# Patient Record
Sex: Female | Born: 1975
Health system: Southern US, Community
[De-identification: ages and names within clinical notes are randomized; demographics above are authoritative.]

## PROBLEM LIST (undated history)

## (undated) DIAGNOSIS — E119 Type 2 diabetes mellitus without complications: Secondary | ICD-10-CM

## (undated) DIAGNOSIS — F909 Attention-deficit hyperactivity disorder, unspecified type: Secondary | ICD-10-CM

## (undated) DIAGNOSIS — N83209 Unspecified ovarian cyst, unspecified side: Secondary | ICD-10-CM

## (undated) DIAGNOSIS — K819 Cholecystitis, unspecified: Secondary | ICD-10-CM

---

## 2003-09-05 ENCOUNTER — Emergency Department (HOSPITAL_COMMUNITY): Admission: EM | Admit: 2003-09-05 | Discharge: 2003-09-05 | Payer: Self-pay | Admitting: Emergency Medicine

## 2003-11-16 ENCOUNTER — Emergency Department (HOSPITAL_COMMUNITY): Admission: EM | Admit: 2003-11-16 | Discharge: 2003-11-16 | Payer: Self-pay | Admitting: Emergency Medicine

## 2003-11-19 ENCOUNTER — Emergency Department (HOSPITAL_COMMUNITY): Admission: EM | Admit: 2003-11-19 | Discharge: 2003-11-19 | Payer: Self-pay | Admitting: Emergency Medicine

## 2007-09-28 ENCOUNTER — Emergency Department (HOSPITAL_COMMUNITY): Admission: EM | Admit: 2007-09-28 | Discharge: 2007-09-28 | Payer: Self-pay | Admitting: Emergency Medicine

## 2008-01-30 ENCOUNTER — Emergency Department (HOSPITAL_COMMUNITY): Admission: EM | Admit: 2008-01-30 | Discharge: 2008-01-31 | Payer: Self-pay | Admitting: Emergency Medicine

## 2009-10-15 ENCOUNTER — Ambulatory Visit: Payer: Self-pay | Admitting: Diagnostic Radiology

## 2009-10-15 ENCOUNTER — Emergency Department (HOSPITAL_BASED_OUTPATIENT_CLINIC_OR_DEPARTMENT_OTHER): Admission: EM | Admit: 2009-10-15 | Discharge: 2009-10-15 | Payer: Self-pay | Admitting: Emergency Medicine

## 2009-12-20 ENCOUNTER — Emergency Department (HOSPITAL_BASED_OUTPATIENT_CLINIC_OR_DEPARTMENT_OTHER): Admission: EM | Admit: 2009-12-20 | Discharge: 2009-12-20 | Payer: Self-pay | Admitting: Emergency Medicine

## 2009-12-20 ENCOUNTER — Ambulatory Visit: Payer: Self-pay | Admitting: Diagnostic Radiology

## 2010-03-08 ENCOUNTER — Emergency Department (HOSPITAL_BASED_OUTPATIENT_CLINIC_OR_DEPARTMENT_OTHER): Admission: EM | Admit: 2010-03-08 | Discharge: 2010-03-08 | Payer: Self-pay | Admitting: Emergency Medicine

## 2010-04-01 ENCOUNTER — Emergency Department (HOSPITAL_BASED_OUTPATIENT_CLINIC_OR_DEPARTMENT_OTHER): Admission: EM | Admit: 2010-04-01 | Discharge: 2010-04-01 | Payer: Self-pay | Admitting: Emergency Medicine

## 2010-05-06 ENCOUNTER — Emergency Department (HOSPITAL_BASED_OUTPATIENT_CLINIC_OR_DEPARTMENT_OTHER): Admission: EM | Admit: 2010-05-06 | Discharge: 2010-05-06 | Payer: Self-pay | Admitting: Emergency Medicine

## 2011-03-10 LAB — URINE CULTURE: Colony Count: 45000

## 2011-03-10 LAB — URINE MICROSCOPIC-ADD ON

## 2011-03-10 LAB — URINALYSIS, ROUTINE W REFLEX MICROSCOPIC
Bilirubin Urine: NEGATIVE
Glucose, UA: 250 mg/dL — AB
Ketones, ur: 15 mg/dL — AB
Nitrite: NEGATIVE
Protein, ur: NEGATIVE mg/dL
Specific Gravity, Urine: 1.028 (ref 1.005–1.030)
Urobilinogen, UA: 0.2 mg/dL (ref 0.0–1.0)
pH: 6 (ref 5.0–8.0)

## 2011-03-10 LAB — ABO/RH: ABO/RH(D): A NEG

## 2011-03-10 LAB — HCG, QUANTITATIVE, PREGNANCY: hCG, Beta Chain, Quant, S: 68426 m[IU]/mL — ABNORMAL HIGH (ref <5)

## 2011-03-12 LAB — URINALYSIS, ROUTINE W REFLEX MICROSCOPIC
Bilirubin Urine: NEGATIVE
Glucose, UA: NEGATIVE mg/dL
Hgb urine dipstick: NEGATIVE
Ketones, ur: NEGATIVE mg/dL
Nitrite: NEGATIVE
Protein, ur: NEGATIVE mg/dL
Specific Gravity, Urine: 1.007 (ref 1.005–1.030)
Urobilinogen, UA: 0.2 mg/dL (ref 0.0–1.0)
pH: 5.5 (ref 5.0–8.0)

## 2011-03-12 LAB — PREGNANCY, URINE: Preg Test, Ur: POSITIVE

## 2011-03-12 LAB — URINE MICROSCOPIC-ADD ON

## 2011-03-12 LAB — GLUCOSE, CAPILLARY: Glucose-Capillary: 149 mg/dL — ABNORMAL HIGH (ref 70–99)

## 2011-03-17 LAB — LIPASE, BLOOD: Lipase: 90 U/L (ref 23–300)

## 2011-03-17 LAB — COMPREHENSIVE METABOLIC PANEL
ALT: 34 U/L (ref 0–35)
AST: 25 U/L (ref 0–37)
Albumin: 4.3 g/dL (ref 3.5–5.2)
Alkaline Phosphatase: 60 U/L (ref 39–117)
BUN: 14 mg/dL (ref 6–23)
CO2: 26 mEq/L (ref 19–32)
Calcium: 9.6 mg/dL (ref 8.4–10.5)
Chloride: 107 mEq/L (ref 96–112)
Creatinine, Ser: 0.7 mg/dL (ref 0.4–1.2)
GFR calc Af Amer: >60 mL/min (ref >60)
GFR calc non Af Amer: >60 mL/min (ref >60)
Glucose, Bld: 165 mg/dL — ABNORMAL HIGH (ref 70–99)
Potassium: 4.2 mEq/L (ref 3.5–5.1)
Sodium: 146 mEq/L — ABNORMAL HIGH (ref 135–145)
Total Bilirubin: 0.8 mg/dL (ref 0.3–1.2)
Total Protein: 8.2 g/dL (ref 6.0–8.3)

## 2011-03-17 LAB — URINALYSIS, ROUTINE W REFLEX MICROSCOPIC
Bilirubin Urine: NEGATIVE
Glucose, UA: NEGATIVE mg/dL
Ketones, ur: NEGATIVE mg/dL
Nitrite: NEGATIVE
Protein, ur: 30 mg/dL — AB
Specific Gravity, Urine: 1.001 — ABNORMAL LOW (ref 1.005–1.030)
Urobilinogen, UA: 0.2 mg/dL (ref 0.0–1.0)
pH: 7 (ref 5.0–8.0)

## 2011-03-17 LAB — URINE MICROSCOPIC-ADD ON

## 2011-03-17 LAB — CBC
HCT: 45 % (ref 36.0–46.0)
Hemoglobin: 15.3 g/dL — ABNORMAL HIGH (ref 12.0–15.0)
MCHC: 34 g/dL (ref 30.0–36.0)
MCV: 86 fL (ref 78.0–100.0)
Platelets: 231 10*3/uL (ref 150–400)
RBC: 5.23 MIL/uL — ABNORMAL HIGH (ref 3.87–5.11)
RDW: 12.8 % (ref 11.5–15.5)
WBC: 8 10*3/uL (ref 4.0–10.5)

## 2011-03-17 LAB — DIFFERENTIAL
Basophils Absolute: 0.1 10*3/uL (ref 0.0–0.1)
Basophils Relative: 1 % (ref 0–1)
Eosinophils Absolute: 0.1 10*3/uL (ref 0.0–0.7)
Eosinophils Relative: 1 % (ref 0–5)
Lymphocytes Relative: 8 % — ABNORMAL LOW (ref 12–46)
Lymphs Abs: 0.7 10*3/uL (ref 0.7–4.0)
Monocytes Absolute: 0.3 10*3/uL (ref 0.1–1.0)
Monocytes Relative: 4 % (ref 3–12)
Neutro Abs: 6.8 10*3/uL (ref 1.7–7.7)
Neutrophils Relative %: 86 % — ABNORMAL HIGH (ref 43–77)

## 2011-03-17 LAB — PREGNANCY, URINE: Preg Test, Ur: NEGATIVE

## 2011-03-17 LAB — URINE CULTURE: Colony Count: 25000

## 2011-03-17 LAB — GLUCOSE, CAPILLARY: Glucose-Capillary: 158 mg/dL — ABNORMAL HIGH (ref 70–99)

## 2011-09-12 LAB — BASIC METABOLIC PANEL
BUN: 11
CO2: 23
Calcium: 9.4
Chloride: 106
Creatinine, Ser: 0.79
GFR calc Af Amer: >60
GFR calc non Af Amer: >60
Glucose, Bld: 163 — ABNORMAL HIGH
Potassium: 4.1
Sodium: 138

## 2012-06-06 ENCOUNTER — Emergency Department (HOSPITAL_BASED_OUTPATIENT_CLINIC_OR_DEPARTMENT_OTHER)
Admission: EM | Admit: 2012-06-06 | Discharge: 2012-06-06 | Disposition: A | Discharge status: Home / Self Care | Payer: Self-pay

## 2012-09-30 ENCOUNTER — Encounter (HOSPITAL_COMMUNITY): Payer: Self-pay | Admitting: *Deleted

## 2012-09-30 ENCOUNTER — Emergency Department (HOSPITAL_COMMUNITY)
Admission: EM | Admit: 2012-09-30 | Discharge: 2012-09-30 | Disposition: A | Discharge status: Home / Self Care | Payer: Medicare Other | Attending: Emergency Medicine | Admitting: Emergency Medicine

## 2012-09-30 DIAGNOSIS — Z882 Allergy status to sulfonamides status: Secondary | ICD-10-CM | POA: Insufficient documentation

## 2012-09-30 DIAGNOSIS — F17200 Nicotine dependence, unspecified, uncomplicated: Secondary | ICD-10-CM | POA: Insufficient documentation

## 2012-09-30 DIAGNOSIS — A6009 Herpesviral infection of other urogenital tract: Secondary | ICD-10-CM

## 2012-09-30 DIAGNOSIS — A6000 Herpesviral infection of urogenital system, unspecified: Secondary | ICD-10-CM | POA: Insufficient documentation

## 2012-09-30 DIAGNOSIS — E119 Type 2 diabetes mellitus without complications: Secondary | ICD-10-CM | POA: Insufficient documentation

## 2012-09-30 LAB — URINALYSIS, ROUTINE W REFLEX MICROSCOPIC
Nitrite: NEGATIVE
Specific Gravity, Urine: 1.041 — ABNORMAL HIGH (ref 1.005–1.030)
Urobilinogen, UA: 0.2 mg/dL (ref 0.0–1.0)
pH: 5 (ref 5.0–8.0)

## 2012-09-30 LAB — URINE MICROSCOPIC-ADD ON

## 2012-09-30 LAB — HCG, QUANTITATIVE, PREGNANCY: hCG, Beta Chain, Quant, S: <1 m[IU]/mL (ref <5)

## 2012-09-30 MED ORDER — VALACYCLOVIR HCL 1 G PO TABS: 1000.0000 mg | ORAL_TABLET | Freq: Two times a day (BID) | ORAL | Status: AC

## 2012-09-30 MED ORDER — OXYCODONE-ACETAMINOPHEN 5-325 MG PO TABS
1.0000 | ORAL_TABLET | Freq: Once | ORAL | Status: AC
  Administered 2012-09-30: 1 via ORAL
  Filled 2012-09-30: qty 1

## 2012-09-30 MED ORDER — OXYCODONE-ACETAMINOPHEN 5-325 MG PO TABS: 1.0000 | ORAL_TABLET | Freq: Four times a day (QID) | ORAL | Status: DC | PRN

## 2012-09-30 NOTE — ED Notes (Signed)
Pt reports vaginal pressure and pain since Sunday.  Pt also reports feeling "raw" and slight itching.  Pt reports seeing her PMD Monday and reports that she did not see anything with the examination.  Pt states "it feels like something is torn."  She also reports shooting pain in anterior R thigh.  Pt denies any injury

## 2012-09-30 NOTE — ED Provider Notes (Signed)
History     CSN: [id number] Arrival date & time 09/30/12  1135 First MD Initiated Contact with Patient 09/30/12 1525      Chief Complaint  Patient presents with  . Pain    vaginal    HPI Pt has feels like the vaginal area is raw and very painful.  It started this past weekend.  Her symptoms started on Monday.  She had a pelvic exam on Monday and was told that she had a yeast infection.  Pt started taking a tablet, she took one on Monday and has another one next week.  (diflucan?).  Today it was worse so she came to the ED.  She was waiting for a referral to GYN but that did not occur yet.  The pain is severe, sharp and raw burning discomfort.  The pain increases with urination too.  She feels a pressure.  Pt had been on steroids for bronchitis and took the last dose yesterday. Past Medical History  Diagnosis Date  . Diabetes mellitus without complication     Past Surgical History  Procedure Date  . Cesarean section 2011    No family history on file.  History  Substance Use Topics  . Smoking status: Current Every Day Smoker -- 0.5 packs/day    Types: Cigarettes  . Smokeless tobacco: Not on file  . Alcohol Use: No    OB History    Grav Para Term Preterm Abortions TAB SAB Ect Mult Living                  Review of Systems  Constitutional: Negative for fever.  Respiratory: Negative for cough.   Gastrointestinal: Negative for vomiting and diarrhea.  Genitourinary: Positive for dysuria and frequency.  Skin: Positive for rash.  All other systems reviewed and are negative.    Allergies  Sulfur  Home Medications   Current Outpatient Rx  Name Route Sig Dispense Refill  . AMPHETAMINE-DEXTROAMPHETAMINE 20 MG PO TABS Oral Take 20 mg by mouth 2 (two) times daily.    . ARIPIPRAZOLE 2 MG PO TABS Oral Take 2 mg by mouth daily.    Marland Kitchen BENZONATATE 200 MG PO CAPS Oral Take 200 mg by mouth 3 (three) times daily as needed. Cough    . BUPROPION HCL ER (XL) 300 MG PO TB24 Oral Take  300 mg by mouth daily.    Marland Kitchen CLARITHROMYCIN 250 MG PO TABS Oral Take 250 mg by mouth 2 (two) times daily.    Marland Kitchen PREDNISONE 10 MG PO TABS Oral Take 10 mg by mouth daily.    Marland Kitchen SITAGLIPTIN-METFORMIN HCL 50-1000 MG PO TABS Oral Take 1 tablet by mouth 2 (two) times daily with a meal.      BP 101/66  Pulse 109  Temp 99.4 F (37.4 C) (Oral)  Resp 20  SpO2 95%  LMP 09/22/2012  Physical Exam  Nursing note and vitals reviewed. Constitutional: She appears well-developed and well-nourished. No distress.       Obese   HENT:  Head: Normocephalic and atraumatic.  Right Ear: External ear normal.  Left Ear: External ear normal.  Eyes: Conjunctivae normal are normal. Right eye exhibits no discharge. Left eye exhibits no discharge. No scleral icterus.  Neck: Neck supple. No tracheal deviation present.  Cardiovascular: Normal rate, regular rhythm and intact distal pulses.   Pulmonary/Chest: Effort normal and breath sounds normal. No stridor. No respiratory distress. She has no wheezes. She has no rales.  Abdominal: Soft. Bowel sounds are normal.  She exhibits no distension. There is no tenderness. There is no rebound and no guarding.  Genitourinary:       Internal exam deferred, externally on pelvic exam multiple vesicular lesions noted on the labia  Musculoskeletal: She exhibits no edema and no tenderness.  Neurological: She is alert. She has normal strength. No sensory deficit. Cranial nerve deficit:  no gross defecits noted. She exhibits normal muscle tone. She displays no seizure activity. Coordination normal.  Skin: Skin is warm and dry. No rash noted.  Psychiatric: She has a normal mood and affect.    ED Course  Procedures (including critical care time)  Labs Reviewed  URINALYSIS, ROUTINE W REFLEX MICROSCOPIC - Abnormal; Notable for the following:    APPearance CLOUDY (*)     Specific Gravity, Urine 1.041 (*)     Glucose, UA >1000 (*)     Ketones, ur TRACE (*)     Leukocytes, UA SMALL (*)      All other components within normal limits  URINE MICROSCOPIC-ADD ON  HCG, QUANTITATIVE, PREGNANCY  HERPES SIMPLEX VIRUS CULTURE   No results found.   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 This could be related to the fact that she recently started a steroid course suppressing her immune system. Internal pelvic exam was deferred as patient states she just had that performed on Monday. She'll be treated with Valtrex and pain medications.  I discussed treatment of this illness with her and recommendations for followup. She will friend from any intercourse        Celene Kras, MD 09/30/12 [phone]

## 2012-09-30 NOTE — Progress Notes (Signed)
[facility] np

## 2012-09-30 NOTE — ED Notes (Signed)
Pt also reports pain is better when squatting.

## 2012-10-28 LAB — HERPES SIMPLEX VIRUS CULTURE: Special Requests: NORMAL

## 2012-11-19 ENCOUNTER — Emergency Department (HOSPITAL_BASED_OUTPATIENT_CLINIC_OR_DEPARTMENT_OTHER)
Admission: EM | Admit: 2012-11-19 | Discharge: 2012-11-19 | Disposition: A | Discharge status: Home / Self Care | Payer: Medicare Other | Attending: Emergency Medicine | Admitting: Emergency Medicine

## 2012-11-19 ENCOUNTER — Encounter (HOSPITAL_BASED_OUTPATIENT_CLINIC_OR_DEPARTMENT_OTHER): Payer: Self-pay | Admitting: Emergency Medicine

## 2012-11-19 ENCOUNTER — Emergency Department (HOSPITAL_BASED_OUTPATIENT_CLINIC_OR_DEPARTMENT_OTHER): Payer: Medicare Other

## 2012-11-19 DIAGNOSIS — J069 Acute upper respiratory infection, unspecified: Secondary | ICD-10-CM | POA: Insufficient documentation

## 2012-11-19 DIAGNOSIS — F17200 Nicotine dependence, unspecified, uncomplicated: Secondary | ICD-10-CM | POA: Insufficient documentation

## 2012-11-19 DIAGNOSIS — E119 Type 2 diabetes mellitus without complications: Secondary | ICD-10-CM | POA: Insufficient documentation

## 2012-11-19 DIAGNOSIS — R059 Cough, unspecified: Secondary | ICD-10-CM | POA: Insufficient documentation

## 2012-11-19 DIAGNOSIS — N760 Acute vaginitis: Secondary | ICD-10-CM | POA: Insufficient documentation

## 2012-11-19 DIAGNOSIS — R05 Cough: Secondary | ICD-10-CM | POA: Insufficient documentation

## 2012-11-19 DIAGNOSIS — Z79899 Other long term (current) drug therapy: Secondary | ICD-10-CM | POA: Insufficient documentation

## 2012-11-19 DIAGNOSIS — B9689 Other specified bacterial agents as the cause of diseases classified elsewhere: Secondary | ICD-10-CM

## 2012-11-19 LAB — WET PREP, GENITAL
Clue Cells Wet Prep HPF POC: NONE SEEN
Trich, Wet Prep: NONE SEEN
Yeast Wet Prep HPF POC: NONE SEEN

## 2012-11-19 LAB — URINE MICROSCOPIC-ADD ON

## 2012-11-19 LAB — GLUCOSE, CAPILLARY: Glucose-Capillary: 202 mg/dL — ABNORMAL HIGH (ref 70–99)

## 2012-11-19 LAB — URINALYSIS, ROUTINE W REFLEX MICROSCOPIC
Glucose, UA: >1000 mg/dL — AB
Hgb urine dipstick: NEGATIVE
Ketones, ur: 15 mg/dL — AB
Protein, ur: NEGATIVE mg/dL
Urobilinogen, UA: 1 mg/dL (ref 0.0–1.0)

## 2012-11-19 MED ORDER — METRONIDAZOLE 500 MG PO TABS
500.0000 mg | ORAL_TABLET | Freq: Once | ORAL | Status: AC
  Administered 2012-11-19: 500 mg via ORAL
  Filled 2012-11-19: qty 1

## 2012-11-19 MED ORDER — METRONIDAZOLE 500 MG PO TABS: 500.0000 mg | ORAL_TABLET | Freq: Two times a day (BID) | ORAL | Status: DC

## 2012-11-19 NOTE — ED Provider Notes (Signed)
History     CSN: [id number]  Arrival date & time 11/19/12  1150   First MD Initiated Contact with Patient 11/19/12 1323      Chief Complaint  Patient presents with  . Generalized Body Aches    (Consider location/radiation/quality/duration/timing/severity/associated sxs/prior treatment) HPI Comments: This is a 36 year old female, who presents emergency department with chief complaint of generalized body aches and dry cough which started 2 days ago. Patient has taken Aleve and Mucinex with some relief. She denies fever, headache, chest pain, shortness of breath, abdominal pain nausea, vomiting, diarrhea, constipation. Patient states that she is in moderate discomfort. She also complains of vaginal discharge. Her symptoms have been worsening.  The history is provided by the patient. No language interpreter was used.    Past Medical History  Diagnosis Date  . Diabetes mellitus without complication     Past Surgical History  Procedure Date  . Cesarean section 2011    History reviewed. No pertinent family history.  History  Substance Use Topics  . Smoking status: Current Every Day Smoker -- 0.5 packs/day    Types: Cigarettes  . Smokeless tobacco: Not on file  . Alcohol Use: No    OB History    Grav Para Term Preterm Abortions TAB SAB Ect Mult Living                  Review of Systems  All other systems reviewed and are negative.    Allergies  Sulfur  Home Medications   Current Outpatient Rx  Name  Route  Sig  Dispense  Refill  . AMPHETAMINE-DEXTROAMPHETAMINE 20 MG PO TABS   Oral   Take 20 mg by mouth 2 (two) times daily.         . ARIPIPRAZOLE 2 MG PO TABS   Oral   Take 2 mg by mouth daily.         Marland Kitchen BENZONATATE 200 MG PO CAPS   Oral   Take 200 mg by mouth 3 (three) times daily as needed. Cough         . BUPROPION HCL ER (XL) 300 MG PO TB24   Oral   Take 300 mg by mouth daily.         . OXYCODONE-ACETAMINOPHEN 5-325 MG PO TABS   Oral  Take 1-2 tablets by mouth every 6 (six) hours as needed for pain.   16 tablet   0   . PREDNISONE 10 MG PO TABS   Oral   Take 10 mg by mouth daily.         Marland Kitchen SITAGLIPTIN-METFORMIN HCL 50-1000 MG PO TABS   Oral   Take 1 tablet by mouth 2 (two) times daily with a meal.           BP 139/75  Pulse 100  Temp 99.2 F (37.3 C) (Oral)  Resp 22  SpO2 97%  LMP 11/05/2012  Physical Exam  Nursing note and vitals reviewed. Constitutional: She is oriented to person, place, and time. She appears well-developed and well-nourished.  HENT:  Head: Normocephalic and atraumatic.  Right Ear: External ear normal.  Left Ear: External ear normal.  Mouth/Throat: Oropharynx is clear and moist. No oropharyngeal exudate.       Swollen, erythematous turbinates, maxillary sinuses tender to palpation  Eyes: Conjunctivae normal and EOM are normal. Pupils are equal, round, and reactive to light.  Neck: Normal range of motion. Neck supple.  Cardiovascular: Normal rate, regular rhythm and normal heart sounds.  Exam  reveals no gallop and no friction rub.   No murmur heard. Pulmonary/Chest: Effort normal and breath sounds normal. No respiratory distress. She has no wheezes. She has no rales. She exhibits no tenderness.       Dry cough nonproductive  Abdominal: Soft. Bowel sounds are normal. She exhibits no distension and no mass. There is no tenderness. There is no rebound and no guarding.  Genitourinary: There is no rash, tenderness, lesion or injury on the right labia. There is no rash, tenderness, lesion or injury on the left labia. Cervix exhibits no motion tenderness, no discharge and no friability. Right adnexum displays no mass, no tenderness and no fullness. Left adnexum displays no mass, no tenderness and no fullness. No erythema, tenderness or bleeding around the vagina. No foreign body around the vagina. No signs of injury around the vagina. Vaginal discharge found.  Musculoskeletal: Normal range of  motion. She exhibits no edema and no tenderness.  Neurological: She is alert and oriented to person, place, and time.  Skin: Skin is warm and dry.  Psychiatric: She has a normal mood and affect. Her behavior is normal. Judgment and thought content normal.    ED Course  Procedures (including critical care time)  Labs Reviewed  WET PREP, GENITAL - Abnormal; Notable for the following:    WBC, Wet Prep HPF POC FEW (*)     All other components within normal limits  URINALYSIS, ROUTINE W REFLEX MICROSCOPIC - Abnormal; Notable for the following:    Color, Urine ORANGE (*)  BIOCHEMICALS MAY BE AFFECTED BY COLOR   Specific Gravity, Urine 1.036 (*)     Glucose, UA >1000 (*)     Bilirubin Urine SMALL (*)     Ketones, ur 15 (*)     Leukocytes, UA TRACE (*)     All other components within normal limits  URINE MICROSCOPIC-ADD ON - Abnormal; Notable for the following:    Squamous Epithelial / LPF FEW (*)     Bacteria, UA FEW (*)     All other components within normal limits  GLUCOSE, CAPILLARY - Abnormal; Notable for the following:    Glucose-Capillary 202 (*)     All other components within normal limits  GC/CHLAMYDIA PROBE AMP  URINE CULTURE   Dg Chest 2 View  11/19/2012  *RADIOLOGY REPORT*  Clinical Data: Cough and congestion  CHEST - 2 VIEW  Comparison: December 20, 2009  Findings: Lungs clear.  Heart size and pulmonary vascularity are normal.  No adenopathy.  No bone lesions.  IMPRESSION: Lungs clear.   Original Report Authenticated By: Bretta Bang, M.D.      1. URI (upper respiratory infection)   2. BV (bacterial vaginosis)       MDM  This is a 36 year old female with URI and BV. I'm going to discharge the patient to home with PCP followup. I will give the patient metronidazole to treat her BV. Encouraged increased fluid intake, and alternating Tylenol and ibuprofen. Patient understands and agrees with the plan. She is stable and ready for discharge.        Roxy Horseman, PA-C 11/19/12 1558

## 2012-11-19 NOTE — ED Provider Notes (Signed)
Medical screening examination/treatment/procedure(s) were performed by non-physician practitioner and as supervising physician I was immediately available for consultation/collaboration.  Martha K Linker, MD 11/19/12 1558 

## 2012-11-19 NOTE — ED Notes (Signed)
D/c home with rx x 1 for flagyl- pt directed to pharmacy to pick up prescriptions- d/c home with family to drive

## 2012-11-19 NOTE — ED Notes (Signed)
Generalized body aches and dry cough which started 2 days ago.  Took aleve and mucinex at 5am today.  No fever at home that she is aware of but does report that she did have the chills.

## 2012-11-20 LAB — URINE CULTURE

## 2012-11-21 LAB — GC/CHLAMYDIA PROBE AMP: CT Probe RNA: NEGATIVE

## 2013-12-21 ENCOUNTER — Encounter (HOSPITAL_BASED_OUTPATIENT_CLINIC_OR_DEPARTMENT_OTHER): Payer: Self-pay | Admitting: Emergency Medicine

## 2013-12-21 ENCOUNTER — Emergency Department (HOSPITAL_BASED_OUTPATIENT_CLINIC_OR_DEPARTMENT_OTHER)
Admission: EM | Admit: 2013-12-21 | Discharge: 2013-12-21 | Disposition: A | Discharge status: Home / Self Care | Payer: Medicare Other | Attending: Emergency Medicine | Admitting: Emergency Medicine

## 2013-12-21 DIAGNOSIS — R630 Anorexia: Secondary | ICD-10-CM | POA: Insufficient documentation

## 2013-12-21 DIAGNOSIS — IMO0002 Reserved for concepts with insufficient information to code with codable children: Secondary | ICD-10-CM | POA: Insufficient documentation

## 2013-12-21 DIAGNOSIS — B3789 Other sites of candidiasis: Secondary | ICD-10-CM

## 2013-12-21 DIAGNOSIS — E119 Type 2 diabetes mellitus without complications: Secondary | ICD-10-CM | POA: Insufficient documentation

## 2013-12-21 DIAGNOSIS — J069 Acute upper respiratory infection, unspecified: Secondary | ICD-10-CM | POA: Insufficient documentation

## 2013-12-21 DIAGNOSIS — F17200 Nicotine dependence, unspecified, uncomplicated: Secondary | ICD-10-CM | POA: Insufficient documentation

## 2013-12-21 DIAGNOSIS — R5381 Other malaise: Secondary | ICD-10-CM | POA: Insufficient documentation

## 2013-12-21 DIAGNOSIS — Z79899 Other long term (current) drug therapy: Secondary | ICD-10-CM | POA: Insufficient documentation

## 2013-12-21 DIAGNOSIS — R062 Wheezing: Secondary | ICD-10-CM

## 2013-12-21 DIAGNOSIS — Z792 Long term (current) use of antibiotics: Secondary | ICD-10-CM | POA: Insufficient documentation

## 2013-12-21 DIAGNOSIS — IMO0001 Reserved for inherently not codable concepts without codable children: Secondary | ICD-10-CM | POA: Insufficient documentation

## 2013-12-21 MED ORDER — NYSTATIN 100000 UNIT/GM EX POWD: Freq: Two times a day (BID) | CUTANEOUS | Status: DC

## 2013-12-21 MED ORDER — ALBUTEROL SULFATE HFA 108 (90 BASE) MCG/ACT IN AERS
4.0000 | INHALATION_SPRAY | Freq: Once | RESPIRATORY_TRACT | Status: AC
  Administered 2013-12-21: 4 via RESPIRATORY_TRACT
  Filled 2013-12-21: qty 6.7

## 2013-12-21 MED ORDER — PREDNISONE 20 MG PO TABS: ORAL_TABLET | ORAL | Status: DC

## 2013-12-21 NOTE — ED Notes (Signed)
C/o cough started 12/26-fever last night-advil last dose 4 hours PTA-minor daughter here to be seen with same s/s

## 2013-12-21 NOTE — ED Provider Notes (Signed)
CSN: [id number]     Arrival date & time 12/21/13  1211 History   First MD Initiated Contact with Patient 12/21/13 1236     Chief Complaint  Patient presents with  . URI   (Consider location/radiation/quality/duration/timing/severity/associated sxs/prior Treatment) Patient is a 37 y.o. female presenting with URI. The history is provided by the patient. No language interpreter was used.  URI Presenting symptoms: congestion, cough, fatigue, fever and rhinorrhea   Presenting symptoms: no sore throat   Presenting symptoms comment:  Wheezing Congestion:    Location:  Chest and nasal   Interferes with sleep: no     Interferes with eating/drinking: no   Cough:    Cough characteristics:  Dry   Severity:  Moderate   Duration:  4 days   Timing:  Constant   Progression:  Unchanged   Chronicity:  New Fever:    Temp source:  Subjective Associated symptoms: myalgias, sneezing and wheezing   Associated symptoms: no arthralgias, no headaches, no neck pain and no swollen glands   Wheezing:    Severity:  Moderate   Onset quality:  Gradual   Duration:  4 days   Timing:  Intermittent   Progression:  Waxing and waning Risk factors: diabetes mellitus and sick contacts   Risk factors: not elderly, no chronic cardiac disease, no chronic kidney disease, no chronic respiratory disease, no recent illness and no recent travel     Past Medical History  Diagnosis Date  . Diabetes mellitus without complication    Past Surgical History  Procedure Laterality Date  . Cesarean section  2011   No family history on file. History  Substance Use Topics  . Smoking status: Current Every Day Smoker -- 0.50 packs/day    Types: Cigarettes  . Smokeless tobacco: Not on file  . Alcohol Use: No   OB History   Grav Para Term Preterm Abortions TAB SAB Ect Mult Living                 Review of Systems  Constitutional: Positive for fever, activity change, appetite change and fatigue. Negative for chills and  diaphoresis.  HENT: Positive for congestion, rhinorrhea and sneezing. Negative for facial swelling and sore throat.   Eyes: Negative for photophobia and discharge.  Respiratory: Positive for cough and wheezing. Negative for chest tightness and shortness of breath.   Cardiovascular: Negative for chest pain, palpitations and leg swelling.  Gastrointestinal: Negative for nausea, vomiting, abdominal pain and diarrhea.  Endocrine: Negative for polydipsia and polyuria.  Genitourinary: Negative for dysuria, frequency, difficulty urinating and pelvic pain.  Musculoskeletal: Positive for myalgias. Negative for arthralgias, back pain, neck pain and neck stiffness.  Skin: Negative for color change and wound.  Allergic/Immunologic: Negative for immunocompromised state.  Neurological: Negative for facial asymmetry, weakness, numbness and headaches.  Hematological: Does not bruise/bleed easily.  Psychiatric/Behavioral: Negative for confusion and agitation.    Allergies  Sulfa antibiotics and Sulfur  Home Medications   Current Outpatient Rx  Name  Route  Sig  Dispense  Refill  . Exenatide (BYDUREON [place])   Subcutaneous   Inject into the skin.         Marland Kitchen amphetamine-dextroamphetamine (ADDERALL) 20 MG tablet   Oral   Take 20 mg by mouth 2 (two) times daily.         . ARIPiprazole (ABILIFY) 2 MG tablet   Oral   Take 2 mg by mouth daily.         . benzonatate (TESSALON) 200  MG capsule   Oral   Take 200 mg by mouth 3 (three) times daily as needed. Cough         . buPROPion (WELLBUTRIN XL) 300 MG 24 hr tablet   Oral   Take 300 mg by mouth daily.         . metroNIDAZOLE (FLAGYL) 500 MG tablet   Oral   Take 1 tablet (500 mg total) by mouth 2 (two) times daily.   14 tablet   0   . nystatin (MYCOSTATIN) powder   Topical   Apply topically 2 (two) times daily.   15 g   0   . oxyCODONE-acetaminophen (PERCOCET/ROXICET) 5-325 MG per tablet   Oral   Take 1-2 tablets by mouth every 6  (six) hours as needed for pain.   16 tablet   0   . predniSONE (DELTASONE) 10 MG tablet   Oral   Take 10 mg by mouth daily.         . predniSONE (DELTASONE) 20 MG tablet      3 tabs po day one, then 2 po daily x 4 days   11 tablet   0   . sitaGLIPtan-metformin (JANUMET) 50-1000 MG per tablet   Oral   Take 1 tablet by mouth 2 (two) times daily with a meal.          BP 121/85  Pulse 105  Temp(Src) 97.7 F (36.5 C) (Oral)  Resp 18  Ht 5' 4.5" (1.638 m)  Wt 285 lb (129.275 kg)  BMI 48.18 kg/m2  SpO2 98%  LMP 11/21/2013 Physical Exam  Constitutional: She is oriented to person, place, and time. She appears well-developed and well-nourished. No distress.  HENT:  Head: Normocephalic and atraumatic.  Mouth/Throat: No oropharyngeal exudate.  Eyes: Pupils are equal, round, and reactive to light.  Neck: Normal range of motion. Neck supple.  Cardiovascular: Normal rate, regular rhythm and normal heart sounds.  Exam reveals no gallop and no friction rub.   No murmur heard. Pulmonary/Chest: Effort normal. No respiratory distress. She has wheezes in the right upper field, the right middle field, the right lower field, the left upper field, the left middle field and the left lower field. She has no rales.  Abdominal: Soft. Bowel sounds are normal. She exhibits no distension and no mass. There is no tenderness. There is no rebound and no guarding.  Musculoskeletal: Normal range of motion. She exhibits no edema and no tenderness.  Neurological: She is alert and oriented to person, place, and time.  Skin: Skin is warm and dry.  Psychiatric: She has a normal mood and affect.    ED Course  Procedures (including critical care time) Labs Review Labs Reviewed - No data to display Imaging Review No results found.  EKG Interpretation   None       MDM   1. URI (upper respiratory infection)   2. Wheezing   3. Candida rash of groin    SUBJECTIVE:  Mary Novak is a 37  y.o. female who complains of congestion, dry cough, myalgias, fever, chills and wheezing for 4 days. She denies a history of vomiting and denies a history of asthma, though has used inhalers when ill in past. Patient admits to smoke cigarettes.   OBJECTIVE: She appears well, vital signs are as noted. Ears normal.  Throat and pharynx normal.  Neck supple. No adenopathy in the neck. Nose is congested. Sinuses non tender. Scattered wheezes heard w/o inc WOB.  ASSESSMENT:  viral upper respiratory illness  PLAN: Symptomatic therapy suggested: push fluids, rest, use acetaminophen, aspirin prn and return ED visit prn if symptoms persist or worsen. Albuterol to be used Q 4 hrs. 5 days of prednisone given.  Lack of antibiotic effectiveness discussed with her. Return to clinic ED if these symptoms worsen or fail to improve as anticipated.     Shanna Cisco, MD 12/21/13 [phone]

## 2014-05-29 ENCOUNTER — Emergency Department (HOSPITAL_BASED_OUTPATIENT_CLINIC_OR_DEPARTMENT_OTHER): Payer: No Typology Code available for payment source

## 2014-05-29 ENCOUNTER — Encounter (HOSPITAL_BASED_OUTPATIENT_CLINIC_OR_DEPARTMENT_OTHER): Payer: Self-pay | Admitting: Emergency Medicine

## 2014-05-29 ENCOUNTER — Emergency Department (HOSPITAL_BASED_OUTPATIENT_CLINIC_OR_DEPARTMENT_OTHER)
Admission: EM | Admit: 2014-05-29 | Discharge: 2014-05-29 | Disposition: A | Discharge status: Home / Self Care | Payer: No Typology Code available for payment source | Attending: Emergency Medicine | Admitting: Emergency Medicine

## 2014-05-29 DIAGNOSIS — S139XXA Sprain of joints and ligaments of unspecified parts of neck, initial encounter: Secondary | ICD-10-CM | POA: Insufficient documentation

## 2014-05-29 DIAGNOSIS — Z79899 Other long term (current) drug therapy: Secondary | ICD-10-CM | POA: Diagnosis not present

## 2014-05-29 DIAGNOSIS — Y9389 Activity, other specified: Secondary | ICD-10-CM | POA: Diagnosis not present

## 2014-05-29 DIAGNOSIS — F17200 Nicotine dependence, unspecified, uncomplicated: Secondary | ICD-10-CM | POA: Insufficient documentation

## 2014-05-29 DIAGNOSIS — S0993XA Unspecified injury of face, initial encounter: Secondary | ICD-10-CM | POA: Diagnosis present

## 2014-05-29 DIAGNOSIS — IMO0002 Reserved for concepts with insufficient information to code with codable children: Secondary | ICD-10-CM | POA: Insufficient documentation

## 2014-05-29 DIAGNOSIS — E119 Type 2 diabetes mellitus without complications: Secondary | ICD-10-CM | POA: Insufficient documentation

## 2014-05-29 DIAGNOSIS — Z792 Long term (current) use of antibiotics: Secondary | ICD-10-CM | POA: Insufficient documentation

## 2014-05-29 DIAGNOSIS — J209 Acute bronchitis, unspecified: Secondary | ICD-10-CM | POA: Diagnosis not present

## 2014-05-29 DIAGNOSIS — S0990XA Unspecified injury of head, initial encounter: Secondary | ICD-10-CM | POA: Insufficient documentation

## 2014-05-29 DIAGNOSIS — Y92410 Unspecified street and highway as the place of occurrence of the external cause: Secondary | ICD-10-CM | POA: Insufficient documentation

## 2014-05-29 DIAGNOSIS — J329 Chronic sinusitis, unspecified: Secondary | ICD-10-CM

## 2014-05-29 DIAGNOSIS — S161XXA Strain of muscle, fascia and tendon at neck level, initial encounter: Secondary | ICD-10-CM

## 2014-05-29 DIAGNOSIS — J40 Bronchitis, not specified as acute or chronic: Secondary | ICD-10-CM

## 2014-05-29 MED ORDER — AZITHROMYCIN 250 MG PO TABS: 250.0000 mg | ORAL_TABLET | Freq: Every day | ORAL | Status: DC

## 2014-05-29 MED ORDER — HYDROCODONE-ACETAMINOPHEN 5-325 MG PO TABS
2.0000 | ORAL_TABLET | Freq: Once | ORAL | Status: AC
  Administered 2014-05-29: 2 via ORAL
  Filled 2014-05-29: qty 2

## 2014-05-29 MED ORDER — HYDROCODONE-ACETAMINOPHEN 5-325 MG PO TABS: 1.0000 | ORAL_TABLET | ORAL | Status: DC | PRN

## 2014-05-29 MED ORDER — IBUPROFEN 800 MG PO TABS: 800.0000 mg | ORAL_TABLET | Freq: Three times a day (TID) | ORAL | Status: DC | PRN

## 2014-05-29 MED ORDER — FLUTICASONE PROPIONATE 50 MCG/ACT NA SUSP: 2.0000 | Freq: Every day | NASAL | Status: DC

## 2014-05-29 MED ORDER — ALBUTEROL SULFATE HFA 108 (90 BASE) MCG/ACT IN AERS: 1.0000 | INHALATION_SPRAY | Freq: Four times a day (QID) | RESPIRATORY_TRACT | Status: DC | PRN
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
## 2014-05-29 NOTE — Discharge Instructions (Signed)
Bronchitis Bronchitis is inflammation of the airways that extend from the windpipe into the lungs (bronchi). The inflammation often causes mucus to develop, which leads to a cough. If the inflammation becomes severe, it may cause shortness of breath. CAUSES  Bronchitis may be caused by:   Viral infections.   Bacteria.   Cigarette smoke.   Allergens, pollutants, and other irritants.  SIGNS AND SYMPTOMS  The most common symptom of bronchitis is a frequent cough that produces mucus. Other symptoms include:  Fever.   Body aches.   Chest congestion.   Chills.   Shortness of breath.   Sore throat.  DIAGNOSIS  Bronchitis is usually diagnosed through a medical history and physical exam. Tests, such as chest X-rays, are sometimes done to rule out other conditions.  TREATMENT  You may need to avoid contact with whatever caused the problem (smoking, for example). Medicines are sometimes needed. These may include:  Antibiotics. These may be prescribed if the condition is caused by bacteria.  Cough suppressants. These may be prescribed for relief of cough symptoms.   Inhaled medicines. These may be prescribed to help open your airways and make it easier for you to breathe.   Steroid medicines. These may be prescribed for those with recurrent (chronic) bronchitis. HOME CARE INSTRUCTIONS  Get plenty of rest.   Drink enough fluids to keep your urine clear or pale yellow (unless you have a medical condition that requires fluid restriction). Increasing fluids may help thin your secretions and will prevent dehydration.   Only take over-the-counter or prescription medicines as directed by your health care provider.  Only take antibiotics as directed. Make sure you finish them even if you start to feel better.  Avoid secondhand smoke, irritating chemicals, and strong fumes. These will make bronchitis worse. If you are a smoker, quit smoking. Consider using nicotine gum or  skin patches to help control withdrawal symptoms. Quitting smoking will help your lungs heal faster.   Put a cool-mist humidifier in your bedroom at night to moisten the air. This may help loosen mucus. Change the water in the humidifier daily. You can also run the hot water in your shower and sit in the bathroom with the door closed for 5 10 minutes.   Follow up with your health care provider as directed.   Wash your hands frequently to avoid catching bronchitis again or spreading an infection to others.  SEEK MEDICAL CARE IF: Your symptoms do not improve after 1 week of treatment.  SEEK IMMEDIATE MEDICAL CARE IF:  Your fever increases.  You have chills.   You have chest pain.   You have worsening shortness of breath.   You have bloody sputum.  You faint.  You have lightheadedness.  You have a severe headache.   You vomit repeatedly. MAKE SURE YOU:   Understand these instructions.  Will watch your condition.  Will get help right away if you are not doing well or get worse. Document Released: 12/08/2005 Document Revised: 09/28/2013 Document Reviewed: 08/02/2013 [facility] Patient Information 2014 [place], [place].  Cervical Sprain A cervical sprain is an injury in the neck in which the strong, fibrous tissues (ligaments) that connect your neck bones stretch or tear. Cervical sprains can range from mild to severe. Severe cervical sprains can cause the neck vertebrae to be unstable. This can lead to damage of the spinal cord and can result in serious nervous system problems. The amount of time it takes for a cervical sprain to get better depends  on the cause and extent of the injury. Most cervical sprains heal in 1 to 3 weeks. CAUSES  Severe cervical sprains may be caused by:   Contact sport injuries (such as from football, rugby, wrestling, hockey, auto racing, gymnastics, diving, martial arts, or boxing).   Motor vehicle collisions.   Whiplash injuries. This is  an injury from a sudden forward-and backward whipping movement of the head and neck.  Falls.  Mild cervical sprains may be caused by:   Being in an awkward position, such as while cradling a telephone between your ear and shoulder.   Sitting in a chair that does not offer proper support.   Working at a poorly Marketing executive station.   Looking up or down for long periods of time.  SYMPTOMS   Pain, soreness, stiffness, or a burning sensation in the front, back, or sides of the neck. This discomfort may develop immediately after the injury or slowly, 24 hours or more after the injury.   Pain or tenderness directly in the middle of the back of the neck.   Shoulder or upper back pain.   Limited ability to move the neck.   Headache.   Dizziness.   Weakness, numbness, or tingling in the hands or arms.   Muscle spasms.   Difficulty swallowing or chewing.   Tenderness and swelling of the neck.  DIAGNOSIS  Most of the time your health care provider can diagnose a cervical sprain by taking your history and doing a physical exam. Your health care provider will ask about previous neck injuries and any known neck problems, such as arthritis in the neck. X-rays may be taken to find out if there are any other problems, such as with the bones of the neck. Other tests, such as a CT scan or MRI, may also be needed.  TREATMENT  Treatment depends on the severity of the cervical sprain. Mild sprains can be treated with rest, keeping the neck in place (immobilization), and pain medicines. Severe cervical sprains are immediately immobilized. Further treatment is done to help with pain, muscle spasms, and other symptoms and may include:  Medicines, such as pain relievers, numbing medicines, or muscle relaxants.   Physical therapy. This may involve stretching exercises, strengthening exercises, and posture training. Exercises and improved posture can help stabilize the neck,  strengthen muscles, and help stop symptoms from returning.  HOME CARE INSTRUCTIONS   Put ice on the injured area.   Put ice in a plastic bag.   Place a towel between your skin and the bag.   Leave the ice on for 15 20 minutes, 3 4 times a day.   If your injury was severe, you may have been given a cervical collar to wear. A cervical collar is a two-piece collar designed to keep your neck from moving while it heals.  Do not remove the collar unless instructed by your health care provider.  If you have long hair, keep it outside of the collar.  Ask your health care provider before making any adjustments to your collar. Minor adjustments may be required over time to improve comfort and reduce pressure on your chin or on the back of your head.  Ifyou are allowed to remove the collar for cleaning or bathing, follow your health care provider's instructions on how to do so safely.  Keep your collar clean by wiping it with mild soap and water and drying it completely. If the collar you have been given includes removable pads, remove  them every 1 2 days and hand wash them with soap and water. Allow them to air dry. They should be completely dry before you wear them in the collar.  If you are allowed to remove the collar for cleaning and bathing, wash and dry the skin of your neck. Check your skin for irritation or sores. If you see any, tell your health care provider.  Do not drive while wearing the collar.   Only take over-the-counter or prescription medicines for pain, discomfort, or fever as directed by your health care provider.   Keep all follow-up appointments as directed by your health care provider.   Keep all physical therapy appointments as directed by your health care provider.   Make any needed adjustments to your workstation to promote good posture.   Avoid positions and activities that make your symptoms worse.   Warm up and stretch before being active to help  prevent problems.  SEEK MEDICAL CARE IF:   Your pain is not controlled with medicine.   You are unable to decrease your pain medicine over time as planned.   Your activity level is not improving as expected.  SEEK IMMEDIATE MEDICAL CARE IF:   You develop any bleeding.  You develop stomach upset.  You have signs of an allergic reaction to your medicine.   Your symptoms get worse.   You develop new, unexplained symptoms.   You have numbness, tingling, weakness, or paralysis in any part of your body.  MAKE SURE YOU:   Understand these instructions.  Will watch your condition.  Will get help right away if you are not doing well or get worse. Document Released: 10/05/2007 Document Revised: 09/28/2013 Document Reviewed: 06/15/2013 [facility] Patient Information 2014 [place], [place].  Concussion, Adult A concussion, or closed-head injury, is a brain injury caused by a direct blow to the head or by a quick and sudden movement (jolt) of the head or neck. Concussions are usually not life-threatening. Even so, the effects of a concussion can be serious. If you have had a concussion before, you are more likely to experience concussion-like symptoms after a direct blow to the head.  CAUSES   Direct blow to the head, such as from running into another player during a soccer game, being hit in a fight, or hitting your head on a hard surface.  A jolt of the head or neck that causes the brain to move back and forth inside the skull, such as in a car crash. SIGNS AND SYMPTOMS  The signs of a concussion can be hard to notice. Early on, they may be missed by you, family members, and health care providers. You may look fine but act or feel differently. Symptoms are usually temporary, but they may last for days, weeks, or even longer. Some symptoms may appear right away while others may not show up for hours or days. Every head injury is different. Symptoms include:   Mild to moderate  headaches that will not go away.  A feeling of pressure inside your head.  Having more trouble than usual:   Learning or remembering things you have heard.  Answering questions.  Paying attention or concentrating.   Organizing daily tasks.   Making decisions and solving problems.   Slowness in thinking, acting or reacting, speaking, or reading.   Getting lost or being easily confused.   Feeling tired all the time or lacking energy (fatigued).   Feeling drowsy.   Sleep disturbances.   Sleeping more than usual.  Sleeping less than usual.   Trouble falling asleep.   Trouble sleeping (insomnia).   Loss of balance or feeling lightheaded or dizzy.   Nausea or vomiting.   Numbness or tingling.   Increased sensitivity to:   Sounds.   Lights.   Distractions.   Vision problems or eyes that tire easily.   Diminished sense of taste or smell.   Ringing in the ears.   Mood changes such as feeling sad or anxious.   Becoming easily irritated or angry for little or no reason.   Lack of motivation.  Seeing or hearing things other people do not see or hear (hallucinations). DIAGNOSIS  Your health care provider can usually diagnose a concussion based on a description of your injury and symptoms. He or she will ask whether you passed out (lost consciousness) and whether you are having trouble remembering events that happened right before and during your injury.  Your evaluation might include:   A brain scan to look for signs of injury to the brain. Even if the test shows no injury, you may still have a concussion.   Blood tests to be sure other problems are not present. TREATMENT   Concussions are usually treated in an emergency department, in urgent care, or at a clinic. You may need to stay in the hospital overnight for further treatment.   Tell your health care provider if you are taking any medicines, including prescription  medicines, over-the-counter medicines, and natural remedies. Some medicines, such as blood thinners (anticoagulants) and aspirin, may increase the chance of complications. Also tell your health care provider whether you have had alcohol or are taking illegal drugs. This information may affect treatment.  Your health care provider will send you home with important instructions to follow.  How fast you will recover from a concussion depends on many factors. These factors include how severe your concussion is, what part of your brain was injured, your age, and how healthy you were before the concussion.  Most people with mild injuries recover fully. Recovery can take time. In general, recovery is slower in older persons. Also, persons who have had a concussion in the past or have other medical problems may find that it takes longer to recover from their current injury. HOME CARE INSTRUCTIONS  General Instructions  Carefully follow the directions your health care provider gave you.  Only take over-the-counter or prescription medicines for pain, discomfort, or fever as directed by your health care provider.  Take only those medicines that your health care provider has approved.  Do not drink alcohol until your health care provider says you are well enough to do so. Alcohol and certain other drugs may slow your recovery and can put you at risk of further injury.  If it is harder than usual to remember things, write them down.  If you are easily distracted, try to do one thing at a time. For example, do not try to watch TV while fixing dinner.  Talk with family members or close friends when making important decisions.  Keep all follow-up appointments. Repeated evaluation of your symptoms is recommended for your recovery.  Watch your symptoms and tell others to do the same. Complications sometimes occur after a concussion. Older adults with a brain injury may have a higher risk of serious  complications such as of a blood clot on the brain.  Tell your teachers, school nurse, school counselor, coach, athletic trainer, or work Production designer, theatre/television/film about your injury, symptoms, and restrictions. Tell  them about what you can or cannot do. They should watch for:   Increased problems with attention or concentration.   Increased difficulty remembering or learning new information.   Increased time needed to complete tasks or assignments.   Increased irritability or decreased ability to cope with stress.   Increased symptoms.   Rest. Rest helps the brain to heal. Make sure you:  Get plenty of sleep at night. Avoid staying up late at night.  Keep the same bedtime hours on weekends and weekdays.  Rest during the day. Take daytime naps or rest breaks when you feel tired.  Limit activities that require a lot of thought or concentration. These includes   Doing homework or job-related work.   Watching TV.   Working on the computer.  Avoid any situation where there is potential for another head injury (football, hockey, soccer, basketball, martial arts, downhill snow sports and horseback riding). Your condition will get worse every time you experience a concussion. You should avoid these activities until you are evaluated by the appropriate follow-up caregivers. Returning To Your Regular Activities You will need to return to your normal activities slowly, not all at once. You must give your body and brain enough time for recovery.  Do not return to sports or other athletic activities until your health care provider tells you it is safe to do so.  Ask your health care provider when you can drive, ride a bicycle, or operate heavy machinery. Your ability to react may be slower after a brain injury. Never do these activities if you are dizzy.  Ask your health care provider about when you can return to work or school. Preventing Another Concussion It is very important to avoid another  brain injury, especially before you have recovered. In rare cases, another injury can lead to permanent brain damage, brain swelling, or death. The risk of this is greatest during the first 7 10 days after a head injury. Avoid injuries by:   Wearing a seat belt when riding in a car.   Drinking alcohol only in moderation.   Wearing a helmet when biking, skiing, skateboarding, skating, or doing similar activities.  Avoiding activities that could lead to a second concussion, such as contact or recreational sports, until your health care provider says it is OK.  Taking safety measures in your home.   Remove clutter and tripping hazards from floors and stairways.   Use grab bars in bathrooms and handrails by stairs.   Place non-slip mats on floors and in bathtubs.   Improve lighting in dim areas. SEEK MEDICAL CARE IF:   You have increased problems paying attention or concentrating.   You have increased difficulty remembering or learning new information.   You need more time to complete tasks or assignments than before.   You have increased irritability or decreased ability to cope with stress.  You have more symptoms than before. Seek medical care if you have any of the following symptoms for more than 2 weeks after your injury:   Lasting (chronic) headaches.   Dizziness or balance problems.   Nausea.  Vision problems.   Increased sensitivity to noise or light.   Depression or mood swings.   Anxiety or irritability.   Memory problems.   Difficulty concentrating or paying attention.   Sleep problems.   Feeling tired all the time. SEEK IMMEDIATE MEDICAL CARE IF:   You have severe or worsening headaches. These may be a sign of a blood clot in  the brain.  You have weakness (even if only in one hand, leg, or part of the face).  You have numbness.  You have decreased coordination.   You vomit repeatedly.  You have increased sleepiness.  One  pupil is larger than the other.   You have convulsions.   You have slurred speech.   You have increased confusion. This may be a sign of a blood clot in the brain.  You have increased restlessness, agitation, or irritability.   You are unable to recognize people or places.   You have neck pain.   It is difficult to wake you up.   You have unusual behavior changes.   You lose consciousness. MAKE SURE YOU:   Understand these instructions.  Will watch your condition.  Will get help right away if you are not doing well or get worse. Document Released: 02/28/2004 Document Revised: 08/10/2013 Document Reviewed: 06/30/2013 [facility] Patient Information 2014 [place], [place].  Head Injury, Adult You have received a head injury. It does not appear serious at this time. Headaches and vomiting are common following head injury. It should be easy to awaken from sleeping. Sometimes it is necessary for you to stay in the emergency department for a while for observation. Sometimes admission to the hospital may be needed. After injuries such as yours, most problems occur within the first 24 hours, but side effects may occur up to 7 10 days after the injury. It is important for you to carefully monitor your condition and contact your health care provider or seek immediate medical care if there is a change in your condition. WHAT ARE THE TYPES OF HEAD INJURIES? Head injuries can be as minor as a bump. Some head injuries can be more severe. More severe head injuries include:  A jarring injury to the brain (concussion).  A bruise of the brain (contusion). This mean there is bleeding in the brain that can cause swelling.  A cracked skull (skull fracture).  Bleeding in the brain that collects, clots, and forms a bump (hematoma). WHAT CAUSES A HEAD INJURY? A serious head injury is most likely to happen to someone who is in a car wreck and is not wearing a seat belt. Other causes of major head  injuries include bicycle or motorcycle accidents, sports injuries, and falls. HOW ARE HEAD INJURIES DIAGNOSED? A complete history of the event leading to the injury and your current symptoms will be helpful in diagnosing head injuries. Many times, pictures of the brain, such as CT or MRI are needed to see the extent of the injury. Often, an overnight hospital stay is necessary for observation.  WHEN SHOULD I SEEK IMMEDIATE MEDICAL CARE?  You should get help right away if:  You have confusion or drowsiness.  You feel sick to your stomach (nauseous) or have continued, forceful vomiting.  You have dizziness or unsteadiness that is getting worse.  You have severe, continued headaches not relieved by medicine. Only take over-the-counter or prescription medicines for pain, fever, or discomfort as directed by your health care provider.  You do not have normal function of the arms or legs or are unable to walk.  You notice changes in the black spots in the center of the colored part of your eye (pupil).  You have a clear or bloody fluid coming from your nose or ears.  You have a loss of vision. During the next 24 hours after the injury, you must stay with someone who can watch you for the  warning signs. This person should contact local emergency services (911 in the [place] if you have seizures, you become unconscious, or you are unable to wake up. HOW CAN I PREVENT A HEAD INJURY IN THE FUTURE? The most important factor for preventing major head injuries is avoiding motor vehicle accidents. To minimize the potential for damage to your head, it is crucial to wear seat belts while riding in motor vehicles. Wearing helmets while bike riding and playing collision sports (like football) is also helpful. Also, avoiding dangerous activities around the house will further help reduce your risk of head injury.  WHEN CAN I RETURN TO NORMAL ACTIVITIES AND ATHLETICS? You should be reevaluated by your health care  provider before returning to these activities. If you have any of the following symptoms, you should not return to activities or contact sports until 1 week after the symptoms have stopped:  Persistent headache.  Dizziness or vertigo.  Poor attention and concentration.  Confusion.  Memory problems.  Nausea or vomiting.  Fatigue or tire easily.  Irritability.  Intolerant of bright lights or loud noises.  Anxiety or depression.  Disturbed sleep. MAKE SURE YOU:   Understand these instructions.  Will watch your condition.  Will get help right away if you are not doing well or get worse. Document Released: 12/08/2005 Document Revised: 09/28/2013 Document Reviewed: 08/15/2013 [facility] Patient Information 2014 [place], [place].  Motor Vehicle Collision  It is common to have multiple bruises and sore muscles after a motor vehicle collision (MVC). These tend to feel worse for the first 24 hours. You may have the most stiffness and soreness over the first several hours. You may also feel worse when you wake up the first morning after your collision. After this point, you will usually begin to improve with each day. The speed of improvement often depends on the severity of the collision, the number of injuries, and the location and nature of these injuries. HOME CARE INSTRUCTIONS   Put ice on the injured area.  Put ice in a plastic bag.  Place a towel between your skin and the bag.  Leave the ice on for 15-20 minutes, 03-04 times a day.  Drink enough fluids to keep your urine clear or pale yellow. Do not drink alcohol.  Take a warm shower or bath once or twice a day. This will increase blood flow to sore muscles.  You may return to activities as directed by your caregiver. Be careful when lifting, as this may aggravate neck or back pain.  Only take over-the-counter or prescription medicines for pain, discomfort, or fever as directed by your caregiver. Do not use aspirin. This  may increase bruising and bleeding. SEEK IMMEDIATE MEDICAL CARE IF:  You have numbness, tingling, or weakness in the arms or legs.  You develop severe headaches not relieved with medicine.  You have severe neck pain, especially tenderness in the middle of the back of your neck.  You have changes in bowel or bladder control.  There is increasing pain in any area of the body.  You have shortness of breath, lightheadedness, dizziness, or fainting.  You have chest pain.  You feel sick to your stomach (nauseous), throw up (vomit), or sweat.  You have increasing abdominal discomfort.  There is blood in your urine, stool, or vomit.  You have pain in your shoulder (shoulder strap areas).  You feel your symptoms are getting worse. MAKE SURE YOU:   Understand these instructions.  Will watch your condition.  Will  get help right away if you are not doing well or get worse. Document Released: 12/08/2005 Document Revised: 03/01/2012 Document Reviewed: 05/07/2011 [facility] Patient Information 2014 [place]  Sinusitis Sinusitis is redness, soreness, and swelling (inflammation) of the paranasal sinuses. Paranasal sinuses are air pockets within the bones of your face (beneath the eyes, the middle of the forehead, or above the eyes). In healthy paranasal sinuses, mucus is able to drain out, and air is able to circulate through them by way of your nose. However, when your paranasal sinuses are inflamed, mucus and air can become trapped. This can allow bacteria and other germs to grow and cause infection. Sinusitis can develop quickly and last only a short time (acute) or continue over a long period (chronic). Sinusitis that lasts for more than 12 weeks is considered chronic.  CAUSES  Causes of sinusitis include:  Allergies.  Structural abnormalities, such as displacement of the cartilage that separates your nostrils (deviated septum), which can decrease the air flow through your nose  and sinuses and affect sinus drainage.  Functional abnormalities, such as when the small hairs (cilia) that line your sinuses and help remove mucus do not work properly or are not present. SYMPTOMS  Symptoms of acute and chronic sinusitis are the same. The primary symptoms are pain and pressure around the affected sinuses. Other symptoms include:  Upper toothache.  Earache.  Headache.  Bad breath.  Decreased sense of smell and taste.  A cough, which worsens when you are lying flat.  Fatigue.  Fever.  Thick drainage from your nose, which often is green and may contain pus (purulent).  Swelling and warmth over the affected sinuses. DIAGNOSIS  Your caregiver will perform a physical exam. During the exam, your caregiver may:  Look in your nose for signs of abnormal growths in your nostrils (nasal polyps).  Tap over the affected sinus to check for signs of infection.  View the inside of your sinuses (endoscopy) with a special imaging device with a light attached (endoscope), which is inserted into your sinuses. If your caregiver suspects that you have chronic sinusitis, one or more of the following tests may be recommended:  Allergy tests.  Nasal culture A sample of mucus is taken from your nose and sent to a lab and screened for bacteria.  Nasal cytology A sample of mucus is taken from your nose and examined by your caregiver to determine if your sinusitis is related to an allergy. TREATMENT  Most cases of acute sinusitis are related to a viral infection and will resolve on their own within 10 days. Sometimes medicines are prescribed to help relieve symptoms (pain medicine, decongestants, nasal steroid sprays, or saline sprays).  However, for sinusitis related to a bacterial infection, your caregiver will prescribe antibiotic medicines. These are medicines that will help kill the bacteria causing the infection.  Rarely, sinusitis is caused by a fungal infection. In theses  cases, your caregiver will prescribe antifungal medicine. For some cases of chronic sinusitis, surgery is needed. Generally, these are cases in which sinusitis recurs more than 3 times per year, despite other treatments. HOME CARE INSTRUCTIONS   Drink plenty of water. Water helps thin the mucus so your sinuses can drain more easily.  Use a humidifier.  Inhale steam 3 to 4 times a day (for example, sit in the bathroom with the shower running).  Apply a warm, moist washcloth to your face 3 to 4 times a day, or as directed by your caregiver.  Use  saline nasal sprays to help moisten and clean your sinuses.  Take over-the-counter or prescription medicines for pain, discomfort, or fever only as directed by your caregiver. SEEK IMMEDIATE MEDICAL CARE IF:  You have increasing pain or severe headaches.  You have nausea, vomiting, or drowsiness.  You have swelling around your face.  You have vision problems.  You have a stiff neck.  You have difficulty breathing. MAKE SURE YOU:   Understand these instructions.  Will watch your condition.  Will get help right away if you are not doing well or get worse. Document Released: 12/08/2005 Document Revised: 03/01/2012 Document Reviewed: 12/23/2011 [facility] Patient Information 2014 [place], [place].
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
## 2014-06-02 ENCOUNTER — Emergency Department (HOSPITAL_BASED_OUTPATIENT_CLINIC_OR_DEPARTMENT_OTHER)
Admission: EM | Admit: 2014-06-02 | Discharge: 2014-06-02 | Disposition: A | Discharge status: Home / Self Care | Payer: Medicare Other | Attending: Emergency Medicine | Admitting: Emergency Medicine

## 2014-06-02 ENCOUNTER — Encounter (HOSPITAL_BASED_OUTPATIENT_CLINIC_OR_DEPARTMENT_OTHER): Payer: Self-pay | Admitting: Emergency Medicine

## 2014-06-02 DIAGNOSIS — R112 Nausea with vomiting, unspecified: Secondary | ICD-10-CM

## 2014-06-02 DIAGNOSIS — H53149 Visual discomfort, unspecified: Secondary | ICD-10-CM | POA: Insufficient documentation

## 2014-06-02 DIAGNOSIS — IMO0002 Reserved for concepts with insufficient information to code with codable children: Secondary | ICD-10-CM | POA: Insufficient documentation

## 2014-06-02 DIAGNOSIS — Z79899 Other long term (current) drug therapy: Secondary | ICD-10-CM | POA: Insufficient documentation

## 2014-06-02 DIAGNOSIS — G44309 Post-traumatic headache, unspecified, not intractable: Secondary | ICD-10-CM

## 2014-06-02 DIAGNOSIS — F17200 Nicotine dependence, unspecified, uncomplicated: Secondary | ICD-10-CM | POA: Insufficient documentation

## 2014-06-02 DIAGNOSIS — E119 Type 2 diabetes mellitus without complications: Secondary | ICD-10-CM | POA: Insufficient documentation

## 2014-06-02 DIAGNOSIS — F0781 Postconcussional syndrome: Secondary | ICD-10-CM | POA: Insufficient documentation

## 2014-06-02 MED ORDER — DEXAMETHASONE SODIUM PHOSPHATE 10 MG/ML IJ SOLN
10.0000 mg | Freq: Once | INTRAMUSCULAR | Status: AC
  Administered 2014-06-02: 10 mg via INTRAVENOUS
  Filled 2014-06-02: qty 1

## 2014-06-02 MED ORDER — ONDANSETRON HCL 4 MG PO TABS: 4.0000 mg | ORAL_TABLET | Freq: Three times a day (TID) | ORAL | Status: DC | PRN

## 2014-06-02 MED ORDER — DIPHENHYDRAMINE HCL 50 MG/ML IJ SOLN
12.5000 mg | Freq: Once | INTRAMUSCULAR | Status: AC
  Administered 2014-06-02: 50 mg via INTRAVENOUS
  Filled 2014-06-02: qty 1

## 2014-06-02 MED ORDER — ONDANSETRON HCL 4 MG/2ML IJ SOLN
4.0000 mg | Freq: Once | INTRAMUSCULAR | Status: AC
  Administered 2014-06-02: 4 mg via INTRAVENOUS
  Filled 2014-06-02: qty 2

## 2014-06-02 MED ORDER — METOCLOPRAMIDE HCL 5 MG/ML IJ SOLN
10.0000 mg | Freq: Once | INTRAMUSCULAR | Status: AC
  Administered 2014-06-02: 10 mg via INTRAVENOUS
  Filled 2014-06-02: qty 2
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
## 2014-06-02 NOTE — Discharge Instructions (Signed)
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
## 2014-06-03 NOTE — ED Provider Notes (Signed)
Medical screening examination/treatment/procedure(s) were performed by non-physician practitioner and as supervising physician I was immediately available for consultation/collaboration.   EKG Interpretation None        Vanetta MuldersScott Dareen Gutzwiller, MD 06/03/14 1644

## 2015-08-07 ENCOUNTER — Emergency Department (HOSPITAL_BASED_OUTPATIENT_CLINIC_OR_DEPARTMENT_OTHER)
Admission: EM | Admit: 2015-08-07 | Discharge: 2015-08-07 | Disposition: A | Discharge status: Home / Self Care | Payer: Medicare Other | Attending: Emergency Medicine | Admitting: Emergency Medicine

## 2015-08-07 ENCOUNTER — Encounter (HOSPITAL_BASED_OUTPATIENT_CLINIC_OR_DEPARTMENT_OTHER): Payer: Self-pay | Admitting: *Deleted

## 2015-08-07 DIAGNOSIS — Z79899 Other long term (current) drug therapy: Secondary | ICD-10-CM | POA: Insufficient documentation

## 2015-08-07 DIAGNOSIS — Z7952 Long term (current) use of systemic steroids: Secondary | ICD-10-CM | POA: Insufficient documentation

## 2015-08-07 DIAGNOSIS — L03317 Cellulitis of buttock: Secondary | ICD-10-CM | POA: Insufficient documentation

## 2015-08-07 DIAGNOSIS — Z7951 Long term (current) use of inhaled steroids: Secondary | ICD-10-CM | POA: Diagnosis not present

## 2015-08-07 DIAGNOSIS — Z720 Tobacco use: Secondary | ICD-10-CM | POA: Insufficient documentation

## 2015-08-07 DIAGNOSIS — L0231 Cutaneous abscess of buttock: Secondary | ICD-10-CM

## 2015-08-07 DIAGNOSIS — E119 Type 2 diabetes mellitus without complications: Secondary | ICD-10-CM | POA: Insufficient documentation

## 2015-08-07 LAB — CBC
HCT: 42.1 % (ref 36.0–46.0)
Hemoglobin: 14.4 g/dL (ref 12.0–15.0)
MCH: 28.3 pg (ref 26.0–34.0)
MCHC: 34.2 g/dL (ref 30.0–36.0)
MCV: 82.7 fL (ref 78.0–100.0)
Platelets: 235 10*3/uL (ref 150–400)
RBC: 5.09 MIL/uL (ref 3.87–5.11)
RDW: 13.5 % (ref 11.5–15.5)
WBC: 11 10*3/uL — ABNORMAL HIGH (ref 4.0–10.5)

## 2015-08-07 LAB — CBG MONITORING, ED: Glucose-Capillary: 254 mg/dL — ABNORMAL HIGH (ref 65–99)

## 2015-08-07 LAB — BASIC METABOLIC PANEL
Anion gap: 11 (ref 5–15)
BUN: 11 mg/dL (ref 6–20)
CO2: 25 mmol/L (ref 22–32)
CREATININE: 0.58 mg/dL (ref 0.44–1.00)
Calcium: 9.2 mg/dL (ref 8.9–10.3)
Chloride: 98 mmol/L — ABNORMAL LOW (ref 101–111)
GFR calc Af Amer: >60 mL/min (ref >60)
Glucose, Bld: 244 mg/dL — ABNORMAL HIGH (ref 65–99)
POTASSIUM: 4 mmol/L (ref 3.5–5.1)
SODIUM: 134 mmol/L — AB (ref 135–145)

## 2015-08-07 MED ORDER — KETOROLAC TROMETHAMINE 30 MG/ML IJ SOLN
30.0000 mg | Freq: Once | INTRAMUSCULAR | Status: AC
  Administered 2015-08-07: 30 mg via INTRAVENOUS
  Filled 2015-08-07: qty 1

## 2015-08-07 MED ORDER — LIDOCAINE HCL (PF) 1 % IJ SOLN
INTRAMUSCULAR | Status: AC
  Filled 2015-08-07: qty 5

## 2015-08-07 MED ORDER — OXYCODONE-ACETAMINOPHEN 5-325 MG PO TABS: 1.0000 | ORAL_TABLET | Freq: Four times a day (QID) | ORAL | Status: DC | PRN

## 2015-08-07 MED ORDER — LIDOCAINE-EPINEPHRINE (PF) 2 %-1:200000 IJ SOLN
10.0000 mL | Freq: Once | INTRAMUSCULAR | Status: AC
  Administered 2015-08-07: 10 mL via INTRADERMAL
  Filled 2015-08-07: qty 10

## 2015-08-07 MED ORDER — DOXYCYCLINE HYCLATE 100 MG PO CAPS: 100.0000 mg | ORAL_CAPSULE | Freq: Two times a day (BID) | ORAL | Status: DC

## 2015-08-07 MED ORDER — SODIUM CHLORIDE 0.9 % IV BOLUS (SEPSIS)
1000.0000 mL | Freq: Once | INTRAVENOUS | Status: AC
  Administered 2015-08-07: 1000 mL via INTRAVENOUS

## 2015-08-07 NOTE — ED Notes (Signed)
PA at bedside performing I&D, pt tolerated well.

## 2015-08-07 NOTE — Discharge Instructions (Signed)
Take percocet for severe pain only. No driving or operating heavy machinery while taking percocet. This medication may cause drowsiness. Take doxycycline twice daily as directed. Follow-up with your primary care physician in 2 days for recheck and packing removal.  Abscess Care After An abscess (also called a boil or furuncle) is an infected area that contains a collection of pus. Signs and symptoms of an abscess include pain, tenderness, redness, or hardness, or you may feel a moveable soft area under your skin. An abscess can occur anywhere in the body. The infection may spread to surrounding tissues causing cellulitis. A cut (incision) by the surgeon was made over your abscess and the pus was drained out. Gauze may have been packed into the space to provide a drain that will allow the cavity to heal from the inside outwards. The boil may be painful for 5 to 7 days. Most people with a boil do not have high fevers. Your abscess, if seen early, may not have localized, and may not have been lanced. If not, another appointment may be required for this if it does not get better on its own or with medications. HOME CARE INSTRUCTIONS   Only take over-the-counter or prescription medicines for pain, discomfort, or fever as directed by your caregiver.  When you bathe, soak and then remove gauze or iodoform packs at least daily or as directed by your caregiver. You may then wash the wound gently with mild soapy water. Repack with gauze or do as your caregiver directs. SEEK IMMEDIATE MEDICAL CARE IF:   You develop increased pain, swelling, redness, drainage, or bleeding in the wound site.  You develop signs of generalized infection including muscle aches, chills, fever, or a general ill feeling.  An oral temperature above 102 F (38.9 C) develops, not controlled by medication. See your caregiver for a recheck if you develop any of the symptoms described above. If medications (antibiotics) were prescribed,  take them as directed. Document Released: 06/26/2005 Document Revised: 03/01/2012 Document Reviewed: 02/21/2008 [facility] Patient Information 2015 [place], [place]. This information is not intended to replace advice given to you by your health care provider. Make sure you discuss any questions you have with your health care provider.  Cellulitis Cellulitis is an infection of the skin and the tissue beneath it. The infected area is usually red and tender. Cellulitis occurs most often in the arms and lower legs.  CAUSES  Cellulitis is caused by bacteria that enter the skin through cracks or cuts in the skin. The most common types of bacteria that cause cellulitis are staphylococci and streptococci. SIGNS AND SYMPTOMS   Redness and warmth.  Swelling.  Tenderness or pain.  Fever. DIAGNOSIS  Your health care provider can usually determine what is wrong based on a physical exam. Blood tests may also be done. TREATMENT  Treatment usually involves taking an antibiotic medicine. HOME CARE INSTRUCTIONS   Take your antibiotic medicine as directed by your health care provider. Finish the antibiotic even if you start to feel better.  Keep the infected arm or leg elevated to reduce swelling.  Apply a warm cloth to the affected area up to 4 times per day to relieve pain.  Take medicines only as directed by your health care provider.  Keep all follow-up visits as directed by your health care provider. SEEK MEDICAL CARE IF:   You notice red streaks coming from the infected area.  Your red area gets larger or turns dark in color.  Your bone or  joint underneath the infected area becomes painful after the skin has healed.  Your infection returns in the same area or another area.  You notice a swollen bump in the infected area.  You develop new symptoms.  You have a fever. SEEK IMMEDIATE MEDICAL CARE IF:   You feel very sleepy.  You develop vomiting or diarrhea.  You have a general ill  feeling (malaise) with muscle aches and pains. MAKE SURE YOU:   Understand these instructions.  Will watch your condition.  Will get help right away if you are not doing well or get worse. Document Released: 09/17/2005 Document Revised: 04/24/2014 Document Reviewed: 02/23/2012 [facility] Patient Information 2015 [place], [place]. This information is not intended to replace advice given to you by your health care provider. Make sure you discuss any questions you have with your health care provider.  Abscess An abscess is an infected area that contains a collection of pus and debris.It can occur in almost any part of the body. An abscess is also known as a furuncle or boil. CAUSES  An abscess occurs when tissue gets infected. This can occur from blockage of oil or sweat glands, infection of hair follicles, or a minor injury to the skin. As the body tries to fight the infection, pus collects in the area and creates pressure under the skin. This pressure causes pain. People with weakened immune systems have difficulty fighting infections and get certain abscesses more often.  SYMPTOMS Usually an abscess develops on the skin and becomes a painful mass that is red, warm, and tender. If the abscess forms under the skin, you may feel a moveable soft area under the skin. Some abscesses break open (rupture) on their own, but most will continue to get worse without care. The infection can spread deeper into the body and eventually into the bloodstream, causing you to feel ill.  DIAGNOSIS  Your caregiver will take your medical history and perform a physical exam. A sample of fluid may also be taken from the abscess to determine what is causing your infection. TREATMENT  Your caregiver may prescribe antibiotic medicines to fight the infection. However, taking antibiotics alone usually does not cure an abscess. Your caregiver may need to make a small cut (incision) in the abscess to drain the pus. In some cases,  gauze is packed into the abscess to reduce pain and to continue draining the area. HOME CARE INSTRUCTIONS   Only take over-the-counter or prescription medicines for pain, discomfort, or fever as directed by your caregiver.  If you were prescribed antibiotics, take them as directed. Finish them even if you start to feel better.  If gauze is used, follow your caregiver's directions for changing the gauze.  To avoid spreading the infection:  Keep your draining abscess covered with a bandage.  Wash your hands well.  Do not share personal care items, towels, or whirlpools with others.  Avoid skin contact with others.  Keep your skin and clothes clean around the abscess.  Keep all follow-up appointments as directed by your caregiver. SEEK MEDICAL CARE IF:   You have increased pain, swelling, redness, fluid drainage, or bleeding.  You have muscle aches, chills, or a general ill feeling.  You have a fever. MAKE SURE YOU:   Understand these instructions.  Will watch your condition.  Will get help right away if you are not doing well or get worse. Document Released: 09/17/2005 Document Revised: 06/08/2012 Document Reviewed: 02/20/2012 [facility] Patient Information 2015 [place], [place]. This information  is not intended to replace advice given to you by your health care provider. Make sure you discuss any questions you have with your health care provider.

## 2015-08-07 NOTE — ED Provider Notes (Signed)
CSN: [id number]     Arrival date & time 08/07/15  1114 History   First MD Initiated Contact with Patient 08/07/15 1220     Chief Complaint  Patient presents with  . Abscess     (Consider location/radiation/quality/duration/timing/severity/associated sxs/prior Treatment) HPI Comments: 39 year old female presenting with an abscess to her right buttock over the past few days that is increasing in size and pain. It is draining puslike fluid. Pain increased when sitting or standing for long period of time. Reports generalized body aches, fever and chills. MAXIMUM TEMPERATURE 100.9 yesterday. Last took Tylenol 4 hours prior to arrival. History of diabetes and states her blood sugar is not well controlled and she may need to go on insulin soon. Admits to nausea without vomiting.  Patient is a 39 y.o. female presenting with abscess. The history is provided by the patient.  Abscess Associated symptoms: fever and nausea     Past Medical History  Diagnosis Date  . Diabetes mellitus without complication    Past Surgical History  Procedure Laterality Date  . Cesarean section  2011   No family history on file. Social History  Substance Use Topics  . Smoking status: Current Every Day Smoker -- 0.50 packs/day    Types: Cigarettes  . Smokeless tobacco: None  . Alcohol Use: No   OB History    No data available     Review of Systems  Constitutional: Positive for fever and chills.  Gastrointestinal: Positive for nausea.  Musculoskeletal: Positive for myalgias and arthralgias.  Skin: Positive for wound (abscess).  All other systems reviewed and are negative.     Allergies  Sulfa antibiotics and Sulfur  Home Medications   Prior to Admission medications   Medication Sig Start Date End Date Taking? Authorizing Provider  ACYCLOVIR PO Take by mouth.   Yes Historical Provider, MD  albuterol (PROVENTIL HFA;VENTOLIN HFA) 108 (90 BASE) MCG/ACT inhaler Inhale 1-2 puffs into the lungs every 6  (six) hours as needed for wheezing or shortness of breath. 05/29/14   Kristen N Ward, DO  amphetamine-dextroamphetamine (ADDERALL) 20 MG tablet Take 20 mg by mouth 2 (two) times daily.    Historical Provider, MD  ARIPiprazole (ABILIFY) 2 MG tablet Take 2 mg by mouth daily.    Historical Provider, MD  azithromycin (ZITHROMAX) 250 MG tablet Take 1 tablet (250 mg total) by mouth daily. Take first 2 tablets together, then 1 every day until finished. 05/29/14   Kristen N Ward, DO  benzonatate (TESSALON) 200 MG capsule Take 200 mg by mouth 3 (three) times daily as needed. Cough    Historical Provider, MD  buPROPion (WELLBUTRIN XL) 300 MG 24 hr tablet Take 300 mg by mouth daily.    Historical Provider, MD  doxycycline (VIBRAMYCIN) 100 MG capsule Take 1 capsule (100 mg total) by mouth 2 (two) times daily. One po bid x 7 days 08/07/15   Kathrynn Speed, PA-C  Exenatide (BYDUREON [place]) Inject into the skin.    Historical Provider, MD  fluticasone (FLONASE) 50 MCG/ACT nasal spray Place 2 sprays into both nostrils daily. 05/29/14   Kristen N Ward, DO  HYDROcodone-acetaminophen (NORCO/VICODIN) 5-325 MG per tablet Take 1 tablet by mouth every 4 (four) hours as needed. 05/29/14   Kristen N Ward, DO  ibuprofen (ADVIL,MOTRIN) 800 MG tablet Take 1 tablet (800 mg total) by mouth every 8 (eight) hours as needed for mild pain. 05/29/14   Kristen N Ward, DO  metroNIDAZOLE (FLAGYL) 500 MG tablet Take 1 tablet (  500 mg total) by mouth 2 (two) times daily. 11/19/12   Roxy Horseman, PA-C  nystatin (MYCOSTATIN) powder Apply topically 2 (two) times daily. 12/21/13   Toy Cookey, MD  ondansetron (ZOFRAN) 4 MG tablet Take 1 tablet (4 mg total) by mouth every 8 (eight) hours as needed for nausea or vomiting. 06/02/14   Terri Piedra, PA-C  oxyCODONE-acetaminophen (PERCOCET) 5-325 MG per tablet Take 1-2 tablets by mouth every 6 (six) hours as needed for severe pain. 08/07/15   Roemello Speyer M Obdulio Mash, PA-C  predniSONE (DELTASONE) 10 MG tablet Take 10 mg  by mouth daily.    Historical Provider, MD  predniSONE (DELTASONE) 20 MG tablet 3 tabs po day one, then 2 po daily x 4 days 12/21/13   Toy Cookey, MD  sitaGLIPtan-metformin (JANUMET) 50-1000 MG per tablet Take 1 tablet by mouth 2 (two) times daily with a meal.    Historical Provider, MD   BP 122/74 mmHg  Pulse 92  Temp(Src) 99.5 F (37.5 C) (Oral)  Resp 18  Ht 5\' 4"  (1.626 m)  Wt 280 lb (127.007 kg)  BMI 48.04 kg/m2  SpO2 97%  LMP 06/22/2015 Physical Exam  Constitutional: She is oriented to person, place, and time. She appears well-developed and well-nourished. No distress.  HENT:  Head: Normocephalic and atraumatic.  Mouth/Throat: Oropharynx is clear and moist.  Eyes: Conjunctivae are normal.  Neck: Normal range of motion. Neck supple.  Cardiovascular: Normal rate, regular rhythm and normal heart sounds.   Pulmonary/Chest: Effort normal and breath sounds normal.  Abdominal: Soft. Bowel sounds are normal. There is no tenderness.  Musculoskeletal: Normal range of motion. She exhibits no edema.  Neurological: She is alert and oriented to person, place, and time.  Skin: Skin is warm and dry. She is not diaphoretic.  5 cm area of induration on right buttock with central ulceration and purulent drainage. Surrounding cellulitis. Very tender.  Psychiatric: She has a normal mood and affect. Her behavior is normal.    ED Course  Procedures (including critical care time) INCISION AND DRAINAGE Performed by: Celene Skeen Consent: Verbal consent obtained. Risks and benefits: risks, benefits and alternatives were discussed Type: abscess  Body area: right buttock  Anesthesia: local infiltration  Incision was made with a scalpel.  Local anesthetic: lidocaine 2% with epinephrine  Anesthetic total: 3 ml  Complexity: complex Blunt dissection to break up loculations  Drainage: purulent  Drainage amount: large  Packing material: 1/4 in iodoform gauze  Patient tolerance: Patient  tolerated the procedure well with no immediate complications.   Labs Review Labs Reviewed  CBG MONITORING, ED - Abnormal; Notable for the following:    Glucose-Capillary 254 (*)    All other components within normal limits  CBC  BASIC METABOLIC PANEL    Imaging Review No results found. I have personally reviewed and evaluated these images and lab results as part of my medical decision-making.   EKG Interpretation None      MDM   Final diagnoses:  Abscess of buttock, right  Cellulitis of right buttock   Abscess with cellulitis. Nontoxic appearing, NAD. Afebrile. Mildly tachycardic on arrival. Vitals otherwise stable. Labs significant for leukocytosis of 11,000. Blood glucose 244. Abscess drained with large amount of purulent drainage. Given surrounding cellulitis, will start patient on antibiotics. Discussed importance of blood sugar control. Follow-up with PCP in 2 days for recheck and packing removal. Stable for discharge. Return precautions given. Patient states understanding of treatment care plan and is agreeable.   Nada Boozer  Goldye Tourangeau, PA-C 08/07/15 1414  Pricilla Loveless, MD 08/09/15 [phone]

## 2015-08-07 NOTE — ED Notes (Signed)
Abscess on her buttocks for a few days. It is draining.

## 2015-08-07 NOTE — ED Notes (Signed)
Supplies gathered and placed at bedside for md. 

## 2015-09-15 ENCOUNTER — Encounter (HOSPITAL_BASED_OUTPATIENT_CLINIC_OR_DEPARTMENT_OTHER): Payer: Self-pay | Admitting: Emergency Medicine

## 2015-09-15 ENCOUNTER — Emergency Department (HOSPITAL_BASED_OUTPATIENT_CLINIC_OR_DEPARTMENT_OTHER): Payer: Medicare Other

## 2015-09-15 ENCOUNTER — Emergency Department (HOSPITAL_BASED_OUTPATIENT_CLINIC_OR_DEPARTMENT_OTHER)
Admission: EM | Admit: 2015-09-15 | Discharge: 2015-09-15 | Disposition: A | Discharge status: Home / Self Care | Payer: Medicare Other | Attending: Physician Assistant | Admitting: Physician Assistant

## 2015-09-15 DIAGNOSIS — Z7952 Long term (current) use of systemic steroids: Secondary | ICD-10-CM | POA: Insufficient documentation

## 2015-09-15 DIAGNOSIS — Y9389 Activity, other specified: Secondary | ICD-10-CM | POA: Insufficient documentation

## 2015-09-15 DIAGNOSIS — E119 Type 2 diabetes mellitus without complications: Secondary | ICD-10-CM | POA: Insufficient documentation

## 2015-09-15 DIAGNOSIS — Y9289 Other specified places as the place of occurrence of the external cause: Secondary | ICD-10-CM | POA: Insufficient documentation

## 2015-09-15 DIAGNOSIS — Z79899 Other long term (current) drug therapy: Secondary | ICD-10-CM | POA: Diagnosis not present

## 2015-09-15 DIAGNOSIS — Z7951 Long term (current) use of inhaled steroids: Secondary | ICD-10-CM | POA: Insufficient documentation

## 2015-09-15 DIAGNOSIS — Z720 Tobacco use: Secondary | ICD-10-CM | POA: Diagnosis not present

## 2015-09-15 DIAGNOSIS — Y998 Other external cause status: Secondary | ICD-10-CM | POA: Diagnosis not present

## 2015-09-15 DIAGNOSIS — M25561 Pain in right knee: Secondary | ICD-10-CM

## 2015-09-15 DIAGNOSIS — Z792 Long term (current) use of antibiotics: Secondary | ICD-10-CM | POA: Insufficient documentation

## 2015-09-15 DIAGNOSIS — W1839XA Other fall on same level, initial encounter: Secondary | ICD-10-CM | POA: Diagnosis not present

## 2015-09-15 DIAGNOSIS — G8929 Other chronic pain: Secondary | ICD-10-CM | POA: Diagnosis not present

## 2015-09-15 DIAGNOSIS — S80212A Abrasion, left knee, initial encounter: Secondary | ICD-10-CM | POA: Insufficient documentation

## 2015-09-15 DIAGNOSIS — S8991XA Unspecified injury of right lower leg, initial encounter: Secondary | ICD-10-CM | POA: Insufficient documentation

## 2015-09-15 NOTE — ED Notes (Signed)
Patient states that she fell onto her right knee and heard a crack. Patient has had problems with her knee in the past, however today is a new injury

## 2015-09-15 NOTE — Discharge Instructions (Signed)
Follow up with your regular orthopeadist this week as planned. Use naprosyn as needed.

## 2015-09-15 NOTE — ED Provider Notes (Signed)
CSN: [id number]     Arrival date & time 09/15/15  1745 History  This chart was scribed for Laroy Mustard Randall An, MD by Lyndel Safe, ED Scribe. This patient was seen in room MH02/MH02 and the patient's care was started 6:14 PM.   Chief Complaint  Patient presents with  . Knee Pain   The history is provided by the patient. No language interpreter was used.   HPI Comments: Mary Novak is a 39 y.o. female, with a PMhx of DM, who presents to the Emergency Department complaining of acute worsening of chronic right knee pain s/p mechanical fall that occurred PTA. Pt reports she fell onto her bilateral knees and heard a crack in her right knee while getting out of her car. She has an associated small abrasion to left, anterior knee. Pt ambulating with a limp s/p fall. She notes a history of sharp arthralgias in right knee due to diminishing cartilage. No PShx to right knee. She has an appointment with an orthopedist in 4 days. Her current pain is exacerbated with flexion and extension of right knee. She has taken naprosyn with mild to no relief. Denies LOC or head injury, or wound to right knee.   Past Medical History  Diagnosis Date  . Diabetes mellitus without complication    Past Surgical History  Procedure Laterality Date  . Cesarean section  2011   History reviewed. No pertinent family history. Social History  Substance Use Topics  . Smoking status: Current Every Day Smoker -- 0.50 packs/day    Types: Cigarettes  . Smokeless tobacco: None  . Alcohol Use: No   OB History    No data available     Review of Systems  Musculoskeletal: Positive for arthralgias ( right knee).  Skin: Positive for wound ( abrasion to left knee).  Neurological: Negative for syncope.  Hematological: Does not bruise/bleed easily.  All other systems reviewed and are negative.  Allergies  Sulfa antibiotics and Sulfur  Home Medications   Prior to Admission medications   Medication Sig Start  Date End Date Taking? Authorizing Provider  ACYCLOVIR PO Take by mouth.    Historical Provider, MD  albuterol (PROVENTIL HFA;VENTOLIN HFA) 108 (90 BASE) MCG/ACT inhaler Inhale 1-2 puffs into the lungs every 6 (six) hours as needed for wheezing or shortness of breath. 05/29/14   Kristen N Ward, DO  amphetamine-dextroamphetamine (ADDERALL) 20 MG tablet Take 20 mg by mouth 2 (two) times daily.    Historical Provider, MD  ARIPiprazole (ABILIFY) 2 MG tablet Take 2 mg by mouth daily.    Historical Provider, MD  azithromycin (ZITHROMAX) 250 MG tablet Take 1 tablet (250 mg total) by mouth daily. Take first 2 tablets together, then 1 every day until finished. 05/29/14   Kristen N Ward, DO  benzonatate (TESSALON) 200 MG capsule Take 200 mg by mouth 3 (three) times daily as needed. Cough    Historical Provider, MD  buPROPion (WELLBUTRIN XL) 300 MG 24 hr tablet Take 300 mg by mouth daily.    Historical Provider, MD  doxycycline (VIBRAMYCIN) 100 MG capsule Take 1 capsule (100 mg total) by mouth 2 (two) times daily. One po bid x 7 days 08/07/15   Kathrynn Speed, PA-C  Exenatide (BYDUREON [place]) Inject into the skin.    Historical Provider, MD  fluticasone (FLONASE) 50 MCG/ACT nasal spray Place 2 sprays into both nostrils daily. 05/29/14   Kristen N Ward, DO  HYDROcodone-acetaminophen (NORCO/VICODIN) 5-325 MG per tablet Take 1 tablet by  mouth every 4 (four) hours as needed. 05/29/14   Kristen N Ward, DO  ibuprofen (ADVIL,MOTRIN) 800 MG tablet Take 1 tablet (800 mg total) by mouth every 8 (eight) hours as needed for mild pain. 05/29/14   Kristen N Ward, DO  metroNIDAZOLE (FLAGYL) 500 MG tablet Take 1 tablet (500 mg total) by mouth 2 (two) times daily. 11/19/12   Roxy Horseman, PA-C  nystatin (MYCOSTATIN) powder Apply topically 2 (two) times daily. 12/21/13   Toy Cookey, MD  ondansetron (ZOFRAN) 4 MG tablet Take 1 tablet (4 mg total) by mouth every 8 (eight) hours as needed for nausea or vomiting. 06/02/14   Terri Piedra,  PA-C  oxyCODONE-acetaminophen (PERCOCET) 5-325 MG per tablet Take 1-2 tablets by mouth every 6 (six) hours as needed for severe pain. 08/07/15   Robyn M Hess, PA-C  predniSONE (DELTASONE) 10 MG tablet Take 10 mg by mouth daily.    Historical Provider, MD  predniSONE (DELTASONE) 20 MG tablet 3 tabs po day one, then 2 po daily x 4 days 12/21/13   Toy Cookey, MD  sitaGLIPtan-metformin (JANUMET) 50-1000 MG per tablet Take 1 tablet by mouth 2 (two) times daily with a meal.    Historical Provider, MD   BP 127/87 mmHg  Pulse 105  Temp(Src) 99.1 F (37.3 C) (Oral)  Resp 20  Ht  (1.626 m)  Wt 275 lb (124.739 kg)  BMI 47.18 kg/m2  SpO2 100%  LMP 08/23/2015 Physical Exam  Constitutional: She is oriented to person, place, and time. She appears well-developed and well-nourished. No distress.  NAD.  HENT:  Head: Normocephalic.  Mouth/Throat: Oropharynx is clear and moist. No oropharyngeal exudate.  Eyes: Conjunctivae are normal.  Neck: Neck supple.  Cardiovascular: Normal rate and intact distal pulses.   Pulmonary/Chest: No respiratory distress.  Abdominal: Soft. There is no tenderness.  Musculoskeletal: Normal range of motion.  Good ROM in BLE; abrasion to left anterior knee; distal pulses intact.  Neurological: She is alert and oriented to person, place, and time. No cranial nerve deficit.  Alert and oriented X 3; MAE X 4.     ED Course  Procedures  DIAGNOSTIC STUDIES: Oxygen Saturation is 100% on RA, normal by my interpretation.    COORDINATION OF CARE: 6:20 PM Discussed treatment plan with pt at bedside and pt agreed to plan.  Imaging Review No results found. I have personally reviewed and evaluated these images as part of my medical decision-making.   MDM   Final diagnoses:  None   patient is a 39 year old obese female presenting with right knee pain. Patient states she's had a lot of trouble in that right knee. She's had an orthoscope which showed poor cartilage when  she was 39 years old. Patient has a follow-up with her orthopedist on Tuesday. Patient states she fell to her knees bilaterally. She says her right knee hurts worse to worsen her left. She's got full range of motion the knee no external evidence of trauma no bruising no abrasions at all. She got minimal tenderness to palpation. We'll get x-ray. We'll have her follow-up with her regular orthopedist.  I personally performed the services described in this documentation, which was scribed in my presence. The recorded information has been reviewed and is accurate.    Dahmir Epperly Randall An, MD 09/15/15 [phone]

## 2016-01-19 ENCOUNTER — Emergency Department (HOSPITAL_BASED_OUTPATIENT_CLINIC_OR_DEPARTMENT_OTHER): Payer: Medicare Other

## 2016-01-19 ENCOUNTER — Emergency Department (HOSPITAL_BASED_OUTPATIENT_CLINIC_OR_DEPARTMENT_OTHER)
Admission: EM | Admit: 2016-01-19 | Discharge: 2016-01-19 | Disposition: A | Discharge status: Home / Self Care | Payer: Medicare Other | Attending: Emergency Medicine | Admitting: Emergency Medicine

## 2016-01-19 ENCOUNTER — Encounter (HOSPITAL_BASED_OUTPATIENT_CLINIC_OR_DEPARTMENT_OTHER): Payer: Self-pay | Admitting: *Deleted

## 2016-01-19 DIAGNOSIS — R1013 Epigastric pain: Secondary | ICD-10-CM

## 2016-01-19 DIAGNOSIS — Z79899 Other long term (current) drug therapy: Secondary | ICD-10-CM | POA: Diagnosis not present

## 2016-01-19 DIAGNOSIS — F17210 Nicotine dependence, cigarettes, uncomplicated: Secondary | ICD-10-CM | POA: Insufficient documentation

## 2016-01-19 DIAGNOSIS — R300 Dysuria: Secondary | ICD-10-CM | POA: Insufficient documentation

## 2016-01-19 DIAGNOSIS — K8020 Calculus of gallbladder without cholecystitis without obstruction: Secondary | ICD-10-CM | POA: Insufficient documentation

## 2016-01-19 DIAGNOSIS — Z7984 Long term (current) use of oral hypoglycemic drugs: Secondary | ICD-10-CM | POA: Diagnosis not present

## 2016-01-19 DIAGNOSIS — Z7951 Long term (current) use of inhaled steroids: Secondary | ICD-10-CM | POA: Insufficient documentation

## 2016-01-19 DIAGNOSIS — E119 Type 2 diabetes mellitus without complications: Secondary | ICD-10-CM | POA: Insufficient documentation

## 2016-01-19 DIAGNOSIS — R1033 Periumbilical pain: Secondary | ICD-10-CM | POA: Diagnosis present

## 2016-01-19 DIAGNOSIS — R1011 Right upper quadrant pain: Secondary | ICD-10-CM

## 2016-01-19 DIAGNOSIS — Z9889 Other specified postprocedural states: Secondary | ICD-10-CM | POA: Insufficient documentation

## 2016-01-19 LAB — CBC WITH DIFFERENTIAL/PLATELET
Basophils Absolute: 0 10*3/uL (ref 0.0–0.1)
Basophils Relative: 0 %
EOS PCT: 2 %
Eosinophils Absolute: 0.1 10*3/uL (ref 0.0–0.7)
HCT: 44.3 % (ref 36.0–46.0)
HEMOGLOBIN: 15 g/dL (ref 12.0–15.0)
LYMPHS ABS: 1.7 10*3/uL (ref 0.7–4.0)
LYMPHS PCT: 23 %
MCH: 27.8 pg (ref 26.0–34.0)
MCHC: 33.9 g/dL (ref 30.0–36.0)
MCV: 82.2 fL (ref 78.0–100.0)
Monocytes Absolute: 0.4 10*3/uL (ref 0.1–1.0)
Monocytes Relative: 6 %
Neutro Abs: 5.2 10*3/uL (ref 1.7–7.7)
Neutrophils Relative %: 69 %
Platelets: 256 10*3/uL (ref 150–400)
RBC: 5.39 MIL/uL — ABNORMAL HIGH (ref 3.87–5.11)
RDW: 13.1 % (ref 11.5–15.5)
WBC: 7.4 10*3/uL (ref 4.0–10.5)

## 2016-01-19 LAB — COMPREHENSIVE METABOLIC PANEL
ALK PHOS: 55 U/L (ref 38–126)
ALT: 30 U/L (ref 14–54)
AST: 23 U/L (ref 15–41)
Albumin: 3.9 g/dL (ref 3.5–5.0)
Anion gap: 10 (ref 5–15)
BILIRUBIN TOTAL: 0.6 mg/dL (ref 0.3–1.2)
BUN: 12 mg/dL (ref 6–20)
CHLORIDE: 100 mmol/L — AB (ref 101–111)
CO2: 25 mmol/L (ref 22–32)
CREATININE: 0.62 mg/dL (ref 0.44–1.00)
Calcium: 9.3 mg/dL (ref 8.9–10.3)
Glucose, Bld: 342 mg/dL — ABNORMAL HIGH (ref 65–99)
Potassium: 3.9 mmol/L (ref 3.5–5.1)
Sodium: 135 mmol/L (ref 135–145)
TOTAL PROTEIN: 7.4 g/dL (ref 6.5–8.1)

## 2016-01-19 LAB — URINALYSIS, ROUTINE W REFLEX MICROSCOPIC
Bilirubin Urine: NEGATIVE
Glucose, UA: >1000 mg/dL — AB
HGB URINE DIPSTICK: NEGATIVE
KETONES UR: 15 mg/dL — AB
Leukocytes, UA: NEGATIVE
NITRITE: NEGATIVE
Protein, ur: NEGATIVE mg/dL
SPECIFIC GRAVITY, URINE: 1.046 — AB (ref 1.005–1.030)
pH: 5.5 (ref 5.0–8.0)

## 2016-01-19 LAB — URINE MICROSCOPIC-ADD ON: RBC / HPF: NONE SEEN RBC/hpf (ref 0–5)

## 2016-01-19 LAB — LIPASE, BLOOD: Lipase: 32 U/L (ref 11–51)

## 2016-01-19 LAB — CBG MONITORING, ED
GLUCOSE-CAPILLARY: 250 mg/dL — AB (ref 65–99)
Glucose-Capillary: 310 mg/dL — ABNORMAL HIGH (ref 65–99)

## 2016-01-19 MED ORDER — ONDANSETRON HCL 4 MG PO TABS: 4.0000 mg | ORAL_TABLET | Freq: Four times a day (QID) | ORAL | Status: DC

## 2016-01-19 MED ORDER — OXYCODONE-ACETAMINOPHEN 5-325 MG PO TABS: 1.0000 | ORAL_TABLET | Freq: Four times a day (QID) | ORAL | Status: DC | PRN

## 2016-01-19 MED ORDER — MORPHINE SULFATE (PF) 4 MG/ML IV SOLN
4.0000 mg | Freq: Once | INTRAVENOUS | Status: AC
  Administered 2016-01-19: 4 mg via INTRAVENOUS
  Filled 2016-01-19: qty 1

## 2016-01-19 MED ORDER — ONDANSETRON HCL 4 MG/2ML IJ SOLN
4.0000 mg | Freq: Once | INTRAMUSCULAR | Status: AC
  Administered 2016-01-19: 4 mg via INTRAVENOUS
  Filled 2016-01-19: qty 2

## 2016-01-19 MED ORDER — SODIUM CHLORIDE 0.9 % IV BOLUS (SEPSIS)
1000.0000 mL | Freq: Once | INTRAVENOUS | Status: AC
  Administered 2016-01-19: 1000 mL via INTRAVENOUS

## 2016-01-19 NOTE — ED Notes (Signed)
1 liter NS bolus initiated.  ?

## 2016-01-19 NOTE — Discharge Instructions (Signed)
Return to the ED with any concerns including vomiting and not able to keep down liquids, worsening abdominal pain not controlled by medications, decreased level of alertness/lethargy, or any other alarming symptoms

## 2016-01-19 NOTE — ED Notes (Signed)
CBG - 310 

## 2016-01-19 NOTE — ED Notes (Signed)
Patient transported to Ultrasound via stretcher, sr x 2 up 

## 2016-01-19 NOTE — ED Notes (Signed)
Presents with abd pain, onset since yesterday, radiates to back, some diarrhea, but denies N/V. Appetite has been poor, but was able to eat today. After eating pain became worse

## 2016-01-19 NOTE — ED Notes (Signed)
Patient is alert and oriented x4.  She is complaining of lower abdominal pain that started  Yesterday.  Patient denies any history of this issue.  Currently she rates her pain 10 of 10 Without nausea or vomiting.  Patient adds that she did have diarrhea yesterday.

## 2016-01-19 NOTE — ED Provider Notes (Signed)
CSN: [id number]     Arrival date & time 01/19/16  1737 History  By signing my name below, I, Bethel Born, attest that this documentation has been prepared under the direction and in the presence of Jerelyn Scott, MD. Electronically Signed: Bethel Born, ED Scribe. 01/19/2016. 6:46 PM   Chief Complaint  Patient presents with  . Abdominal Pain   Patient is a 40 y.o. female presenting with abdominal pain. The history is provided by the patient. No language interpreter was used.  Abdominal Pain Pain location:  Periumbilical Pain quality: dull   Pain quality comment:  And twisting Pain radiates to:  Does not radiate Pain severity:  Severe Onset quality:  Gradual Duration:  1 day Timing:  Constant Progression:  Unchanged Chronicity:  New Context: eating   Context: not diet changes and not suspicious food intake   Relieved by:  Nothing Worsened by:  Eating Ineffective treatments:  None tried Associated symptoms: diarrhea and dysuria   Associated symptoms: no chest pain, no constipation, no fever, no nausea and no vomiting   Risk factors: obesity    Mary Novak is a 40 y.o. female with history of DM who presents to the Emergency Department complaining of new, constant, dull/twisting, 10/10 in severity, mid abdominal pain with onset yesterday. The pain is worse after eating.  Associated symptoms include decreased appetite, dysuria, and diarrhea yesterday. Pt denies fever, nausea, vomiting, cough, chest pain. No recent diet change. Her blood sugars have been elevated since November 2016 where her A1C was 9.1.  Past Medical History  Diagnosis Date  . Diabetes mellitus without complication [facility])    Past Surgical History  Procedure Laterality Date  . Cesarean section  2011   No family history on file. Social History  Substance Use Topics  . Smoking status: Current Every Day Smoker -- 0.50 packs/day    Types: Cigarettes  . Smokeless tobacco: None  . Alcohol Use: No    OB History    No data available     Review of Systems  Constitutional: Negative for fever.  Cardiovascular: Negative for chest pain.  Gastrointestinal: Positive for abdominal pain and diarrhea. Negative for nausea, vomiting and constipation.  Genitourinary: Positive for dysuria.  All other systems reviewed and are negative.  Allergies  Sulfa antibiotics and Sulfur  Home Medications   Prior to Admission medications   Medication Sig Start Date End Date Taking? Authorizing Provider  ACYCLOVIR PO Take by mouth.    Historical Provider, MD  albuterol (PROVENTIL HFA;VENTOLIN HFA) 108 (90 BASE) MCG/ACT inhaler Inhale 1-2 puffs into the lungs every 6 (six) hours as needed for wheezing or shortness of breath. 05/29/14   Kristen N Ward, DO  amphetamine-dextroamphetamine (ADDERALL) 20 MG tablet Take 20 mg by mouth 2 (two) times daily.    Historical Provider, MD  ARIPiprazole (ABILIFY) 2 MG tablet Take 2 mg by mouth daily.    Historical Provider, MD  azithromycin (ZITHROMAX) 250 MG tablet Take 1 tablet (250 mg total) by mouth daily. Take first 2 tablets together, then 1 every day until finished. 05/29/14   Kristen N Ward, DO  benzonatate (TESSALON) 200 MG capsule Take 200 mg by mouth 3 (three) times daily as needed. Cough    Historical Provider, MD  buPROPion (WELLBUTRIN XL) 300 MG 24 hr tablet Take 300 mg by mouth daily.    Historical Provider, MD  doxycycline (VIBRAMYCIN) 100 MG capsule Take 1 capsule (100 mg total) by mouth 2 (two) times daily. One po bid  x 7 days 08/07/15   Kathrynn Speed, PA-C  Exenatide (BYDUREON [place]) Inject into the skin.    Historical Provider, MD  fluticasone (FLONASE) 50 MCG/ACT nasal spray Place 2 sprays into both nostrils daily. 05/29/14   Kristen N Ward, DO  HYDROcodone-acetaminophen (NORCO/VICODIN) 5-325 MG per tablet Take 1 tablet by mouth every 4 (four) hours as needed. 05/29/14   Kristen N Ward, DO  ibuprofen (ADVIL,MOTRIN) 800 MG tablet Take 1 tablet (800 mg total) by mouth  every 8 (eight) hours as needed for mild pain. 05/29/14   Kristen N Ward, DO  metroNIDAZOLE (FLAGYL) 500 MG tablet Take 1 tablet (500 mg total) by mouth 2 (two) times daily. 11/19/12   Roxy Horseman, PA-C  nystatin (MYCOSTATIN) powder Apply topically 2 (two) times daily. 12/21/13   Toy Cookey, MD  ondansetron (ZOFRAN) 4 MG tablet Take 1 tablet (4 mg total) by mouth every 6 (six) hours. 01/19/16   Jerelyn Scott, MD  oxyCODONE-acetaminophen (PERCOCET/ROXICET) 5-325 MG tablet Take 1-2 tablets by mouth every 6 (six) hours as needed for severe pain. 01/19/16   Jerelyn Scott, MD  predniSONE (DELTASONE) 10 MG tablet Take 10 mg by mouth daily.    Historical Provider, MD  predniSONE (DELTASONE) 20 MG tablet 3 tabs po day one, then 2 po daily x 4 days 12/21/13   Toy Cookey, MD  sitaGLIPtan-metformin (JANUMET) 50-1000 MG per tablet Take 1 tablet by mouth 2 (two) times daily with a meal.    Historical Provider, MD   BP 120/75 mmHg  Pulse 95  Temp(Src) 98.2 F (36.8 C) (Oral)  Resp 20  SpO2 100% Vitals reviewed Physical Exam  Physical Examination: General appearance - alert, well appearing, and in no distress Mental status - alert, oriented to person, place, and time Eyes - no conjunctival injection no scleral icterus Mouth - mucous membranes moist, pharynx normal without lesions Chest - clear to auscultation, no wheezes, rales or rhonchi, symmetric air entry Heart - normal rate, regular rhythm, normal S1, S2, no murmurs, rubs, clicks or gallops Abdomen - soft, epigastric tenderness to palpation, no gaurding or rebound tenderness, nondistended, no masses or organomegaly Neurological - alert, oriented, normal speech Extremities - peripheral pulses normal, no pedal edema, no clubbing or cyanosis Skin - normal coloration and turgor, no rashes  ED Course  Procedures (including critical care time) DIAGNOSTIC STUDIES: Oxygen Saturation is 100% on RA,  normal by my interpretation.    COORDINATION  OF CARE: 6:46 PM Discussed treatment plan which includes lab work, pain management, and IVF with pt at bedside and pt agreed to plan.  Labs Review Labs Reviewed  CBC WITH DIFFERENTIAL/PLATELET - Abnormal; Notable for the following:    RBC 5.39 (*)    All other components within normal limits  COMPREHENSIVE METABOLIC PANEL - Abnormal; Notable for the following:    Chloride 100 (*)    Glucose, Bld 342 (*)    All other components within normal limits  URINALYSIS, ROUTINE W REFLEX MICROSCOPIC (NOT AT [facility]) - Abnormal; Notable for the following:    Specific Gravity, Urine 1.046 (*)    Glucose, UA >1000 (*)    Ketones, ur 15 (*)    All other components within normal limits  URINE MICROSCOPIC-ADD ON - Abnormal; Notable for the following:    Squamous Epithelial / LPF 0-5 (*)    Bacteria, UA MANY (*)    All other components within normal limits  CBG MONITORING, ED - Abnormal; Notable for the following:  Glucose-Capillary 310 (*)    All other components within normal limits  CBG MONITORING, ED - Abnormal; Notable for the following:    Glucose-Capillary 250 (*)    All other components within normal limits  LIPASE, BLOOD    Imaging Review US Abdomen Complete  01/19/2016  CLINICAL DATA:  Epigastric pain for 1 day. Low back pain. Diarrhea yesterday. Previous ultrasound demonstrated gallstones. EXAM: ABDOMEN ULTRASOUND COMPLETE COMPARISON:  11/25/2012 FINDINGS: Gallbladder: Multiple stones demonstrated in the gallbladder with gallbladder contraction. Borderline gallbladder wall thickening. Changes are similar to previous study. Murphy's sign is negative. Common bile duct: Diameter: 1.7 mm, normal Liver: Liver is enlarged with diffusely increased parenchymal echotexture suggesting diffuse fatty infiltration. No focal liver lesions identified. IVC: No abnormality visualized. Pancreas: Pancreas is mostly obscured by overlying bowel gas. Spleen: Spleen is enlarged, measuring 13.3 cm length. Volume is  calculated at 596 mL. No focal splenic lesions. Right Kidney: Length: 13.5 cm. Echogenicity within normal limits. No mass or hydronephrosis visualized. Left Kidney: Length: 14 cm. Echogenicity within normal limits. No mass or hydronephrosis visualized. Abdominal aorta: Mostly obscured by bowel gas. Visualized portions are non aneurysmal. Other findings: None. IMPRESSION: Cholelithiasis and contracted gallbladder with borderline wall thickness. Murphy's sign is negative. Appearance is unchanged since previous study. Diffuse fatty infiltration of the liver. Enlarged spleen. Electronically Signed   By: Burman Nieves M.D.   On: 01/19/2016 19:43   I have personally reviewed and evaluated these lab results as part of my medical decision-making.   EKG Interpretation None      MDM   Final diagnoses:  Cholelithiasis without cholecystitis   Pt presenting with c/o abdominal pain, labs are reassuring.  Ultrasound shows cholelithiasis without cholecystitis- similar to a prior ultrasound several years ago.  Results d/w patient, she states she had never been told about gallstones on prior ultrasound.  Suspect this is the cause of her pain. She feels improved after IV fluids, meds- her blood glucose has improved.  Pt discharged with rx for pain and nausea medication- advised f/u with PMD and surgery.  Discharged with strict return precautions.  Pt agreeable with plan.  I personally performed the services described in this documentation, which was scribed in my presence. The recorded information has been reviewed and is accurate.     Jerelyn Scott, MD 01/19/16 2107

## 2016-01-22 ENCOUNTER — Other Ambulatory Visit: Payer: Self-pay | Admitting: Surgery

## 2016-02-20 DIAGNOSIS — K819 Cholecystitis, unspecified: Secondary | ICD-10-CM

## 2016-02-20 HISTORY — DX: Cholecystitis, unspecified: K81.9

## 2016-02-22 ENCOUNTER — Encounter (HOSPITAL_BASED_OUTPATIENT_CLINIC_OR_DEPARTMENT_OTHER): Payer: Self-pay | Admitting: *Deleted

## 2016-02-22 NOTE — Pre-Procedure Instructions (Signed)
To come for BMET, EKG and anesthesia airway evaluation 

## 2016-02-25 ENCOUNTER — Encounter (HOSPITAL_BASED_OUTPATIENT_CLINIC_OR_DEPARTMENT_OTHER)
Admission: RE | Admit: 2016-02-25 | Discharge: 2016-02-25 | Disposition: A | Discharge status: Home / Self Care | Payer: Medicare Other | Source: Ambulatory Visit | Attending: Surgery | Admitting: Surgery

## 2016-02-25 ENCOUNTER — Other Ambulatory Visit: Payer: Self-pay

## 2016-02-25 DIAGNOSIS — Z6841 Body Mass Index (BMI) 40.0 and over, adult: Secondary | ICD-10-CM | POA: Diagnosis not present

## 2016-02-25 DIAGNOSIS — K8010 Calculus of gallbladder with chronic cholecystitis without obstruction: Secondary | ICD-10-CM | POA: Diagnosis present

## 2016-02-25 DIAGNOSIS — E119 Type 2 diabetes mellitus without complications: Secondary | ICD-10-CM | POA: Diagnosis not present

## 2016-02-25 DIAGNOSIS — F17200 Nicotine dependence, unspecified, uncomplicated: Secondary | ICD-10-CM | POA: Diagnosis not present

## 2016-02-25 LAB — BASIC METABOLIC PANEL
Anion gap: 10 (ref 5–15)
BUN: 9 mg/dL (ref 6–20)
CHLORIDE: 104 mmol/L (ref 101–111)
CO2: 27 mmol/L (ref 22–32)
CREATININE: 0.58 mg/dL (ref 0.44–1.00)
Calcium: 10.1 mg/dL (ref 8.9–10.3)
GFR calc non Af Amer: >60 mL/min (ref >60)
Glucose, Bld: 116 mg/dL — ABNORMAL HIGH (ref 65–99)
POTASSIUM: 4.5 mmol/L (ref 3.5–5.1)
SODIUM: 141 mmol/L (ref 135–145)

## 2016-02-25 NOTE — Pre-Procedure Instructions (Signed)
Pt in for EKG, and BMET. Seen by Dr. Ivin Bootyrews for anesthesia consult.  Pt concerned that she does not have anyone to stay with her that night and may need to stay in [facility].  Instructed to call Dr. Magnus IvanBlackman and make him aware of the situation.

## 2016-02-27 NOTE — H&P (Signed)
  Mary Novak  Location: [facility] Patient #: [id number] DOB: 27-Apr-1976 Married / Language: English / Race: White Female   History of Present Illness  Patient words: Scientist, forensicew-gallbladder.  The patient is a 40 year old female who presents for evaluation of gall stones. This is a pleasant female sent here from the emergency department after a visit therefore symptomatic cholelithiasis. Approximately 4 days ago she had severe epigastric abdominal pain with nausea and vomiting. At that time, she had an ultrasound showing a contracted gallbladder with gallstones and some mild gallbladder wall thickening. The bile duct was normal. Her liver function tests were normal and there was a normal white count. Since then, she is feeling better although not 100 percent. This was her first attack of symptomatic cholelithiasis per her report. She is otherwise without complaints. At the time of the attack, her pain was 10 out of 10 and described as sharp and severe   Allergies Sulfa Antibiotics  Medication History Amphetamine-Dextroamphetamine (30MG  Tablet, Oral) Active. Oxycodone-Acetaminophen (5-325MG  Tablet, Oral) Active. BuPROPion HCl ER (XL) (300MG  Tablet ER 24HR, Oral) Active. Lidocaine (5% Ointment, External) Active. Ondansetron HCl (4MG  Tablet, Oral) Active. Janumet (50-1000MG  Tablet, Oral) Active. Medications Reconciled  Vitals   Weight: 274.6 lb Height: 64in Body Surface Area: 2.24 m Body Mass Index: 47.13 kg/m  Temp.: 98.54F(Oral)  BP: 124/82 (Sitting, Left Arm, Standard)    Physical Exam  General Mental Status-Alert. General Appearance-Consistent with stated age. Hydration-Well hydrated. Voice-Normal.  Head and Neck Head-normocephalic, atraumatic with no lesions or palpable masses.  Eye Eyeball - Bilateral-Extraocular movements intact. Sclera/Conjunctiva - Bilateral-No scleral icterus.  Chest and Lung Exam Chest  and lung exam reveals -quiet, even and easy respiratory effort with no use of accessory muscles and on auscultation, normal breath sounds, no adventitious sounds and normal vocal resonance. Inspection Chest Wall - Normal. Back - normal.  Cardiovascular Cardiovascular examination reveals -on palpation PMI is normal in location and amplitude, no palpable S3 or S4. Normal cardiac borders., normal heart sounds, regular rate and rhythm with no murmurs, carotid auscultation reveals no bruits and normal pedal pulses bilaterally.  Abdomen Inspection Inspection of the abdomen reveals - No Hernias. Skin - Scar - no surgical scars. Palpation/Percussion Palpation and Percussion of the abdomen reveal - Soft, Non Tender, No Rebound tenderness, No Rigidity (guarding) and No hepatosplenomegaly. Auscultation Auscultation of the abdomen reveals - Bowel sounds normal.  Neurologic Neurologic evaluation reveals -alert and oriented x 3 with no impairment of recent or remote memory. Mental Status-Normal.  Musculoskeletal Normal Exam - Left-Upper Extremity Strength Normal and Lower Extremity Strength Normal. Normal Exam - Right-Upper Extremity Strength Normal, Lower Extremity Weakness.    Assessment & Plan   SYMPTOMATIC CHOLELITHIASIS (K80.20)  Impression: I discussed the diagnosis with her in detail and gave her literature regarding surgery. Laparoscopic cholecystectomy is recommended. I discussed this with her in detail. I discussed the risks of surgery which includes but is not limited to bleeding, infection, injury to surrounding structures, bile leak, the need to convert to an open procedure, postoperative recovery, etc. She understands and wishes to proceed with surgery. She may try to wait until spring break or until the summer as she is a Consulting civil engineerstudent. I encouraged her to have surgery sooner to avoid complications from the stones Current Plans Pt Education - Pamphlet Given - Laparoscopic  Gallbladder Surgery: discussed with patient and provided information.

## 2016-02-28 ENCOUNTER — Ambulatory Visit (HOSPITAL_BASED_OUTPATIENT_CLINIC_OR_DEPARTMENT_OTHER): Payer: Medicare Other | Admitting: Anesthesiology

## 2016-02-28 ENCOUNTER — Encounter (HOSPITAL_BASED_OUTPATIENT_CLINIC_OR_DEPARTMENT_OTHER)
Admission: RE | Disposition: A | Discharge status: Home / Self Care | Payer: Self-pay | Source: Ambulatory Visit | Attending: Surgery

## 2016-02-28 ENCOUNTER — Encounter (HOSPITAL_BASED_OUTPATIENT_CLINIC_OR_DEPARTMENT_OTHER): Payer: Self-pay | Admitting: Anesthesiology

## 2016-02-28 ENCOUNTER — Ambulatory Visit (HOSPITAL_BASED_OUTPATIENT_CLINIC_OR_DEPARTMENT_OTHER)
Admission: RE | Admit: 2016-02-28 | Discharge: 2016-02-28 | Disposition: A | Discharge status: Home / Self Care | Payer: Medicare Other | Source: Ambulatory Visit | Attending: Surgery | Admitting: Surgery

## 2016-02-28 DIAGNOSIS — K8010 Calculus of gallbladder with chronic cholecystitis without obstruction: Secondary | ICD-10-CM | POA: Diagnosis not present

## 2016-02-28 DIAGNOSIS — F17200 Nicotine dependence, unspecified, uncomplicated: Secondary | ICD-10-CM | POA: Insufficient documentation

## 2016-02-28 DIAGNOSIS — E119 Type 2 diabetes mellitus without complications: Secondary | ICD-10-CM | POA: Diagnosis not present

## 2016-02-28 DIAGNOSIS — Z6841 Body Mass Index (BMI) 40.0 and over, adult: Secondary | ICD-10-CM | POA: Insufficient documentation

## 2016-02-28 HISTORY — DX: Cholecystitis, unspecified: K81.9

## 2016-02-28 HISTORY — PX: CHOLECYSTECTOMY: SHX55

## 2016-02-28 HISTORY — DX: Type 2 diabetes mellitus without complications: E11.9

## 2016-02-28 HISTORY — DX: Attention-deficit hyperactivity disorder, unspecified type: F90.9

## 2016-02-28 LAB — GLUCOSE, CAPILLARY
GLUCOSE-CAPILLARY: 166 mg/dL — AB (ref 65–99)
Glucose-Capillary: 201 mg/dL — ABNORMAL HIGH (ref 65–99)

## 2016-02-28 SURGERY — LAPAROSCOPIC CHOLECYSTECTOMY
Anesthesia: General | Site: Abdomen

## 2016-02-28 MED ORDER — MIDAZOLAM HCL 2 MG/2ML IJ SOLN
INTRAMUSCULAR | Status: AC
  Filled 2016-02-28: qty 2

## 2016-02-28 MED ORDER — ONDANSETRON HCL 4 MG/2ML IJ SOLN
INTRAMUSCULAR | Status: AC
  Filled 2016-02-28: qty 2

## 2016-02-28 MED ORDER — FENTANYL CITRATE (PF) 100 MCG/2ML IJ SOLN
INTRAMUSCULAR | Status: AC
  Filled 2016-02-28: qty 2

## 2016-02-28 MED ORDER — DEXAMETHASONE SODIUM PHOSPHATE 4 MG/ML IJ SOLN
INTRAMUSCULAR | Status: DC | PRN
  Administered 2016-02-28: 10 mg via INTRAVENOUS

## 2016-02-28 MED ORDER — LIDOCAINE HCL (CARDIAC) 20 MG/ML IV SOLN
INTRAVENOUS | Status: DC | PRN
  Administered 2016-02-28: 60 mg via INTRAVENOUS

## 2016-02-28 MED ORDER — MIDAZOLAM HCL 2 MG/2ML IJ SOLN
1.0000 mg | INTRAMUSCULAR | Status: DC | PRN
  Administered 2016-02-28: 2 mg via INTRAVENOUS

## 2016-02-28 MED ORDER — EPHEDRINE SULFATE 50 MG/ML IJ SOLN
INTRAMUSCULAR | Status: AC
  Filled 2016-02-28: qty 1

## 2016-02-28 MED ORDER — LIDOCAINE HCL (CARDIAC) 20 MG/ML IV SOLN
INTRAVENOUS | Status: AC
  Filled 2016-02-28: qty 5

## 2016-02-28 MED ORDER — KETOROLAC TROMETHAMINE 30 MG/ML IJ SOLN
30.0000 mg | Freq: Once | INTRAMUSCULAR | Status: AC
  Administered 2016-02-28: 30 mg via INTRAVENOUS

## 2016-02-28 MED ORDER — ROCURONIUM BROMIDE 100 MG/10ML IV SOLN
INTRAVENOUS | Status: DC | PRN
  Administered 2016-02-28: 10 mg via INTRAVENOUS
  Administered 2016-02-28: 25 mg via INTRAVENOUS

## 2016-02-28 MED ORDER — CEFAZOLIN SODIUM-DEXTROSE 2-3 GM-% IV SOLR
INTRAVENOUS | Status: AC
  Filled 2016-02-28: qty 50

## 2016-02-28 MED ORDER — BUPIVACAINE-EPINEPHRINE (PF) 0.5% -1:200000 IJ SOLN
INTRAMUSCULAR | Status: DC | PRN
  Administered 2016-02-28: 20 mL via PERINEURAL

## 2016-02-28 MED ORDER — CEFAZOLIN SODIUM 1-5 GM-% IV SOLN
INTRAVENOUS | Status: AC
  Filled 2016-02-28: qty 50

## 2016-02-28 MED ORDER — GLYCOPYRROLATE 0.2 MG/ML IJ SOLN
INTRAMUSCULAR | Status: AC
  Filled 2016-02-28: qty 1

## 2016-02-28 MED ORDER — PHENYLEPHRINE HCL 10 MG/ML IJ SOLN
INTRAMUSCULAR | Status: AC
  Filled 2016-02-28: qty 1

## 2016-02-28 MED ORDER — LABETALOL HCL 5 MG/ML IV SOLN
INTRAVENOUS | Status: DC | PRN
  Administered 2016-02-28: 5 mg via INTRAVENOUS

## 2016-02-28 MED ORDER — ONDANSETRON HCL 4 MG/2ML IJ SOLN
INTRAMUSCULAR | Status: DC | PRN
  Administered 2016-02-28: 4 mg via INTRAVENOUS

## 2016-02-28 MED ORDER — SCOPOLAMINE 1 MG/3DAYS TD PT72: 1.0000 | MEDICATED_PATCH | Freq: Once | TRANSDERMAL | Status: DC | PRN

## 2016-02-28 MED ORDER — HYDROCODONE-ACETAMINOPHEN 5-325 MG PO TABS: 1.0000 | ORAL_TABLET | ORAL | Status: DC | PRN

## 2016-02-28 MED ORDER — ALBUTEROL SULFATE HFA 108 (90 BASE) MCG/ACT IN AERS
INHALATION_SPRAY | RESPIRATORY_TRACT | Status: DC | PRN
  Administered 2016-02-28: 2 via RESPIRATORY_TRACT

## 2016-02-28 MED ORDER — MEPERIDINE HCL 25 MG/ML IJ SOLN: 6.2500 mg | INTRAMUSCULAR | Status: DC | PRN

## 2016-02-28 MED ORDER — SUGAMMADEX SODIUM 200 MG/2ML IV SOLN
INTRAVENOUS | Status: DC | PRN
Start: 2016-02-28 — End: 2016-02-28
  Administered 2016-02-28: 200 mg via INTRAVENOUS

## 2016-02-28 MED ORDER — PROPOFOL 10 MG/ML IV BOLUS
INTRAVENOUS | Status: DC | PRN
  Administered 2016-02-28: 200 mg via INTRAVENOUS
  Administered 2016-02-28: 30 mg via INTRAVENOUS

## 2016-02-28 MED ORDER — SUCCINYLCHOLINE CHLORIDE 20 MG/ML IJ SOLN
INTRAMUSCULAR | Status: AC
  Filled 2016-02-28: qty 1

## 2016-02-28 MED ORDER — FENTANYL CITRATE (PF) 100 MCG/2ML IJ SOLN
50.0000 ug | INTRAMUSCULAR | Status: AC | PRN
  Administered 2016-02-28 (×2): 50 ug via INTRAVENOUS
  Administered 2016-02-28: 100 ug via INTRAVENOUS

## 2016-02-28 MED ORDER — SODIUM CHLORIDE 0.9 % IR SOLN
Status: DC | PRN
  Administered 2016-02-28: 1

## 2016-02-28 MED ORDER — LACTATED RINGERS IV SOLN
INTRAVENOUS | Status: DC
  Administered 2016-02-28: 13:00:00 via INTRAVENOUS

## 2016-02-28 MED ORDER — LACTATED RINGERS IV SOLN
INTRAVENOUS | Status: DC
Start: 2016-02-28 — End: 2016-02-28

## 2016-02-28 MED ORDER — ROCURONIUM BROMIDE 50 MG/5ML IV SOLN
INTRAVENOUS | Status: AC
  Filled 2016-02-28: qty 1

## 2016-02-28 MED ORDER — CEFAZOLIN SODIUM-DEXTROSE 2-3 GM-% IV SOLR
2.0000 g | INTRAVENOUS | Status: AC
  Administered 2016-02-28: 3 g via INTRAVENOUS

## 2016-02-28 MED ORDER — PROPOFOL 10 MG/ML IV BOLUS
INTRAVENOUS | Status: AC
  Filled 2016-02-28: qty 20

## 2016-02-28 MED ORDER — ATROPINE SULFATE 0.4 MG/ML IJ SOLN
INTRAMUSCULAR | Status: AC
  Filled 2016-02-28: qty 1

## 2016-02-28 MED ORDER — FENTANYL CITRATE (PF) 100 MCG/2ML IJ SOLN
25.0000 ug | INTRAMUSCULAR | Status: DC | PRN
  Administered 2016-02-28 (×3): 50 ug via INTRAVENOUS

## 2016-02-28 MED ORDER — PROMETHAZINE HCL 25 MG/ML IJ SOLN: 6.2500 mg | INTRAMUSCULAR | Status: DC | PRN

## 2016-02-28 MED ORDER — DEXAMETHASONE SODIUM PHOSPHATE 10 MG/ML IJ SOLN
INTRAMUSCULAR | Status: AC
  Filled 2016-02-28: qty 1

## 2016-02-28 MED ORDER — LABETALOL HCL 5 MG/ML IV SOLN
INTRAVENOUS | Status: AC
  Filled 2016-02-28: qty 4

## 2016-02-28 MED ORDER — GLYCOPYRROLATE 0.2 MG/ML IJ SOLN: 0.2000 mg | Freq: Once | INTRAMUSCULAR | Status: DC | PRN

## 2016-02-28 MED ORDER — LIDOCAINE HCL 4 % EX SOLN
CUTANEOUS | Status: DC | PRN
  Administered 2016-02-28: 2 mL via TOPICAL

## 2016-02-28 MED ORDER — KETOROLAC TROMETHAMINE 30 MG/ML IJ SOLN
INTRAMUSCULAR | Status: AC
  Filled 2016-02-28: qty 1

## 2016-02-28 SURGICAL SUPPLY — 34 items
APPLIER CLIP 5 13 M/L LIGAMAX5 (MISCELLANEOUS) ×2
BLADE CLIPPER SURG (BLADE) IMPLANT
CHLORAPREP W/TINT 26ML (MISCELLANEOUS) ×2 IMPLANT
CLIP APPLIE 5 13 M/L LIGAMAX5 (MISCELLANEOUS) ×1 IMPLANT
COVER MAYO STAND STRL (DRAPES) IMPLANT
DECANTER SPIKE VIAL GLASS SM (MISCELLANEOUS) IMPLANT
DRAPE C-ARM 42X72 X-RAY (DRAPES) IMPLANT
ELECT REM PT RETURN 9FT ADLT (ELECTROSURGICAL) ×2
ELECTRODE REM PT RTRN 9FT ADLT (ELECTROSURGICAL) ×1 IMPLANT
FILTER SMOKE EVAC LAPAROSHD (FILTER) IMPLANT
GLOVE BIO SURGEON STRL SZ7 (GLOVE) ×2 IMPLANT
GLOVE BIOGEL PI IND STRL 7.5 (GLOVE) ×1 IMPLANT
GLOVE BIOGEL PI INDICATOR 7.5 (GLOVE) ×1
GLOVE SURG SIGNA 7.5 PF LTX (GLOVE) ×2 IMPLANT
GLOVE SURG SS PI 7.0 STRL IVOR (GLOVE) ×2 IMPLANT
GOWN STRL REUS W/ TWL LRG LVL3 (GOWN DISPOSABLE) ×2 IMPLANT
GOWN STRL REUS W/TWL LRG LVL3 (GOWN DISPOSABLE) ×2
LIQUID BAND (GAUZE/BANDAGES/DRESSINGS) ×2 IMPLANT
NS IRRIG 1000ML POUR BTL (IV SOLUTION) IMPLANT
PACK BASIN DAY SURGERY FS (CUSTOM PROCEDURE TRAY) ×2 IMPLANT
POUCH SPECIMEN RETRIEVAL 10MM (ENDOMECHANICALS) ×2 IMPLANT
SCISSORS LAP 5X35 DISP (ENDOMECHANICALS) ×2 IMPLANT
SET CHOLANGIOGRAPH 5 50 .035 (SET/KITS/TRAYS/PACK) IMPLANT
SET IRRIG TUBING LAPAROSCOPIC (IRRIGATION / IRRIGATOR) ×2 IMPLANT
SLEEVE ENDOPATH XCEL 5M (ENDOMECHANICALS) ×4 IMPLANT
SLEEVE SCD COMPRESS KNEE MED (MISCELLANEOUS) ×2 IMPLANT
SPECIMEN JAR SMALL (MISCELLANEOUS) IMPLANT
SUT MON AB 4-0 PC3 18 (SUTURE) ×2 IMPLANT
TOWEL OR 17X24 6PK STRL BLUE (TOWEL DISPOSABLE) ×2 IMPLANT
TRAY LAPAROSCOPIC (CUSTOM PROCEDURE TRAY) ×2 IMPLANT
TROCAR XCEL BLUNT TIP 100MML (ENDOMECHANICALS) ×2 IMPLANT
TROCAR XCEL NON-BLD 5MMX100MML (ENDOMECHANICALS) ×2 IMPLANT
TUBE CONNECTING 20X1/4 (TUBING) ×2 IMPLANT
TUBING INSUFFLATION (TUBING) ×2 IMPLANT

## 2016-02-28 NOTE — Anesthesia Procedure Notes (Addendum)
Procedure Name: Intubation Date/Time: 02/28/2016 1:08 PM Performed by: Baxter Flattery Pre-anesthesia Checklist: Patient identified, Emergency Drugs available, Suction available and Patient being monitored Patient Re-evaluated:Patient Re-evaluated prior to inductionOxygen Delivery Method: Circle System Utilized Preoxygenation: Pre-oxygenation with 100% oxygen Intubation Type: IV induction Ventilation: Mask ventilation without difficulty Laryngoscope Size: 2 and Miller Grade View: Grade II Tube type: Oral Tube size: 7.0 mm Number of attempts: 1 Airway Equipment and Method: Stylet,  Oral airway and LTA kit utilized Placement Confirmation: ETT inserted through vocal cords under direct vision,  positive ETCO2 and breath sounds checked- equal and bilateral Secured at: 22 cm Tube secured with: Tape Dental Injury: Teeth and Oropharynx as per pre-operative assessment

## 2016-02-28 NOTE — Anesthesia Postprocedure Evaluation (Signed)
Anesthesia Post Note  Patient: Mary RamusChristina M Novak  Procedure(s) Performed: Procedure(s) (LRB): LAPAROSCOPIC CHOLECYSTECTOMY (N/A)  Patient location during evaluation: PACU Anesthesia Type: General Level of consciousness: awake and alert Pain management: pain level controlled Vital Signs Assessment: post-procedure vital signs reviewed and stable Respiratory status: spontaneous breathing, nonlabored ventilation, respiratory function stable and patient connected to nasal cannula oxygen Cardiovascular status: blood pressure returned to baseline and stable Postop Assessment: no signs of nausea or vomiting Anesthetic complications: no    Last Vitals:  Filed Vitals:   02/28/16 1500 02/28/16 1515  BP: 128/93 120/76  Pulse: 81 81  Temp:    Resp: 18 18    Last Pain:  Filed Vitals:   02/28/16 1519  PainSc: 4                  Phillips Groutarignan, Jovannie Ulibarri

## 2016-02-28 NOTE — Discharge Instructions (Signed)
[facility] ______[facility] LAPAROSCOPIC SURGERY: POST OP INSTRUCTIONS Always review your discharge instruction sheet given to you by the facility where your surgery was performed. IF YOU HAVE DISABILITY OR FAMILY LEAVE FORMS, YOU MUST BRING THEM TO THE OFFICE FOR PROCESSING.   DO NOT GIVE THEM TO YOUR DOCTOR.  1. A prescription for pain medication may be given to you upon discharge.  Take your pain medication as prescribed, if needed.  If narcotic pain medicine is not needed, then you may take acetaminophen (Tylenol) or ibuprofen (Advil) as needed. 2. Take your usually prescribed medications unless otherwise directed. 3. If you need a refill on your pain medication, please contact your pharmacy.  They will contact our office to request authorization. Prescriptions will not be filled after 5pm or on week-ends. 4. You should follow a light diet the first few days after arrival home, such as soup and crackers, etc.  Be sure to include lots of fluids daily. 5. Most patients will experience some swelling and bruising in the area of the incisions.  Ice packs will help.  Swelling and bruising can take several days to resolve.  6. It is common to experience some constipation if taking pain medication after surgery.  Increasing fluid intake and taking a stool softener (such as Colace) will usually help or prevent this problem from occurring.  A mild laxative (Milk of Magnesia or Miralax) should be taken according to package instructions if there are no bowel movements after 48 hours. 7. Unless discharge instructions indicate otherwise, you may remove your bandages 24-48 hours after surgery, and you may shower at that time.  You may have steri-strips (small skin tapes) in place directly over the incision.  These strips should be left on the skin for 7-10 days.  If your surgeon used skin glue on the incision, you may shower in 24 hours.  The glue will flake off over the next 2-3 weeks.  Any sutures or  staples will be removed at the office during your follow-up visit. 8. ACTIVITIES:  You may resume regular (light) daily activities beginning the next day--such as daily self-care, walking, climbing stairs--gradually increasing activities as tolerated.  You may have sexual intercourse when it is comfortable.  Refrain from any heavy lifting or straining until approved by your doctor. a. You may drive when you are no longer taking prescription pain medication, you can comfortably wear a seatbelt, and you can safely maneuver your car and apply brakes. b. RETURN TO WORK:  __________________________________________________________ 9. You should see your doctor in the office for a follow-up appointment approximately 2-3 weeks after your surgery.  Make sure that you call for this appointment within a day or two after you arrive home to insure a convenient appointment time. 10. OTHER INSTRUCTIONS:no lifting more than 15 to 20 pounds for 2 weeks 11. Ice pack and ibuprofen also for  pain __________________________________________________________________________________________________________________________ __________________________________________________________________________________________________________________________ WHEN TO CALL YOUR DOCTOR: 1. Fever over 101.0 2. Inability to urinate 3. Continued bleeding from incision. 4. Increased pain, redness, or drainage from the incision. 5. Increasing abdominal pain  The clinic staff is available to answer your questions during regular business hours.  Please dont hesitate to call and ask to speak to one of the nurses for clinical concerns.  If you have a medical emergency, go to the nearest emergency room or call 911.  A surgeon from [facility] is always on call at the hospital. [address], [address], [place], [place]  [address] ?  [id number], [place], [place]   [id number] [phone] ? [phone] ? FAX [phone] Web site:  [email or web address]     Post Anesthesia Home Care Instructions  Activity: Get plenty of rest for the remainder of the day. A responsible adult should stay with you for 24 hours following the procedure.  For the next 24 hours, DO NOT: -Drive a car -Advertising copywriterperate machinery -Drink alcoholic beverages -Take any medication unless instructed by your physician -Make any legal decisions or sign important papers.  Meals: Start with liquid foods such as gelatin or soup. Progress to regular foods as tolerated. Avoid greasy, spicy, heavy foods. If nausea and/or vomiting occur, drink only clear liquids until the nausea and/or vomiting subsides. Call your physician if vomiting continues.  Special Instructions/Symptoms: Your throat may feel dry or sore from the anesthesia or the breathing tube placed in your throat during surgery. If this causes discomfort, gargle with warm salt water. The discomfort should disappear within 24 hours.  If you had a scopolamine patch placed behind your ear for the management of post- operative nausea and/or vomiting:  1. The medication in the patch is effective for 72 hours, after which it should be removed.  Wrap patch in a tissue and discard in the trash. Wash hands thoroughly with soap and water. 2. You may remove the patch earlier than 72 hours if you experience unpleasant side effects which may include dry mouth, dizziness or visual disturbances. 3. Avoid touching the patch. Wash your hands with soap and water after contact with the patch.

## 2016-02-28 NOTE — Transfer of Care (Signed)
Immediate Anesthesia Transfer of Care Note  Patient: Mary Novak  Procedure(s) Performed: Procedure(s): LAPAROSCOPIC CHOLECYSTECTOMY (N/A)  Patient Location: PACU  Anesthesia Type:General  Level of Consciousness: awake, sedated and patient cooperative  Airway & Oxygen Therapy: Patient Spontanous Breathing and Patient connected to face mask oxygen  Post-op Assessment: Report given to RN, Post -op Vital signs reviewed and stable and Patient moving all extremities  Post vital signs: Reviewed and stable  Last Vitals:  Filed Vitals:   02/28/16 1227  BP: 117/80  Pulse: 89  Temp: 37.1 C  Resp: 20    Complications: No apparent anesthesia complications

## 2016-02-28 NOTE — Anesthesia Preprocedure Evaluation (Addendum)
Anesthesia Evaluation  Patient identified by MRN, date of birth, ID band Patient awake    Reviewed: Allergy & Precautions, NPO status , Patient's Chart, lab work & pertinent test results  Airway Mallampati: II  TM Distance: >3 FB Neck ROM: Full    Dental no notable dental hx.    Pulmonary Current Smoker,    Pulmonary exam normal breath sounds clear to auscultation       Cardiovascular negative cardio ROS Normal cardiovascular exam Rhythm:Regular Rate:Normal     Neuro/Psych negative neurological ROS  negative psych ROS   GI/Hepatic negative GI ROS, Neg liver ROS,   Endo/Other  diabetes, Type obesity  Renal/GU negative Renal ROS  negative genitourinary   Musculoskeletal negative musculoskeletal ROS (+)   Abdominal   Peds negative pediatric ROS (+)  Hematology negative hematology ROS (+)   Anesthesia Other Findings   Reproductive/Obstetrics negative OB ROS                            Anesthesia Physical Anesthesia Plan  ASA: II  Anesthesia Plan: General   Post-op Pain Management:    Induction: Intravenous  Airway Management Planned: Oral ETT  Additional Equipment:   Intra-op Plan:   Post-operative Plan: Extubation in OR  Informed Consent: I have reviewed the patients History and Physical, chart, labs and discussed the procedure including the risks, benefits and alternatives for the proposed anesthesia with the patient or authorized representative who has indicated his/her understanding and acceptance.   Dental advisory given  Plan Discussed with: CRNA  Anesthesia Plan Comments:         Anesthesia Quick Evaluation

## 2016-02-28 NOTE — Interval H&P Note (Signed)
History and Physical Interval Note: no change in H and P  02/28/2016 12:41 PM  Mary Novak  has presented today for surgery, with the diagnosis of Symptomatic cholecystitis  The various methods of treatment have been discussed with the patient and family. After consideration of risks, benefits and other options for treatment, the patient has consented to  Procedure(s): LAPAROSCOPIC CHOLECYSTECTOMY (N/A) as a surgical intervention .  The patient's history has been reviewed, patient examined, no change in status, stable for surgery.  I have reviewed the patient's chart and labs.  Questions were answered to the patient's satisfaction.     Seniah Lawrence A

## 2016-02-28 NOTE — Op Note (Signed)

## 2016-02-29 ENCOUNTER — Encounter (HOSPITAL_BASED_OUTPATIENT_CLINIC_OR_DEPARTMENT_OTHER): Payer: Self-pay | Admitting: Surgery

## 2016-03-28 ENCOUNTER — Encounter (HOSPITAL_BASED_OUTPATIENT_CLINIC_OR_DEPARTMENT_OTHER): Payer: Self-pay | Admitting: Surgery

## 2016-04-22 ENCOUNTER — Emergency Department (HOSPITAL_BASED_OUTPATIENT_CLINIC_OR_DEPARTMENT_OTHER): Payer: Medicare Other

## 2016-04-22 ENCOUNTER — Encounter (HOSPITAL_BASED_OUTPATIENT_CLINIC_OR_DEPARTMENT_OTHER): Payer: Self-pay

## 2016-04-22 ENCOUNTER — Emergency Department (HOSPITAL_BASED_OUTPATIENT_CLINIC_OR_DEPARTMENT_OTHER)
Admission: EM | Admit: 2016-04-22 | Discharge: 2016-04-22 | Disposition: A | Discharge status: Home / Self Care | Payer: Medicare Other | Attending: Emergency Medicine | Admitting: Emergency Medicine

## 2016-04-22 DIAGNOSIS — R079 Chest pain, unspecified: Secondary | ICD-10-CM | POA: Diagnosis present

## 2016-04-22 DIAGNOSIS — M546 Pain in thoracic spine: Secondary | ICD-10-CM | POA: Diagnosis not present

## 2016-04-22 DIAGNOSIS — F17210 Nicotine dependence, cigarettes, uncomplicated: Secondary | ICD-10-CM | POA: Insufficient documentation

## 2016-04-22 DIAGNOSIS — F909 Attention-deficit hyperactivity disorder, unspecified type: Secondary | ICD-10-CM | POA: Diagnosis not present

## 2016-04-22 DIAGNOSIS — E119 Type 2 diabetes mellitus without complications: Secondary | ICD-10-CM | POA: Diagnosis not present

## 2016-04-22 DIAGNOSIS — M549 Dorsalgia, unspecified: Secondary | ICD-10-CM

## 2016-04-22 DIAGNOSIS — Z79899 Other long term (current) drug therapy: Secondary | ICD-10-CM | POA: Diagnosis not present

## 2016-04-22 DIAGNOSIS — R0789 Other chest pain: Secondary | ICD-10-CM | POA: Insufficient documentation

## 2016-04-22 LAB — BASIC METABOLIC PANEL
Anion gap: 7 (ref 5–15)
BUN: 12 mg/dL (ref 6–20)
CALCIUM: 9.2 mg/dL (ref 8.9–10.3)
CO2: 25 mmol/L (ref 22–32)
CREATININE: 0.57 mg/dL (ref 0.44–1.00)
Chloride: 103 mmol/L (ref 101–111)
GFR calc non Af Amer: >60 mL/min (ref >60)
Glucose, Bld: 270 mg/dL — ABNORMAL HIGH (ref 65–99)
Potassium: 4.1 mmol/L (ref 3.5–5.1)
SODIUM: 135 mmol/L (ref 135–145)

## 2016-04-22 LAB — TROPONIN I: Troponin I: <0.03 ng/mL (ref <0.031)

## 2016-04-22 LAB — CBC
HEMATOCRIT: 39.2 % (ref 36.0–46.0)
Hemoglobin: 13.5 g/dL (ref 12.0–15.0)
MCH: 28.8 pg (ref 26.0–34.0)
MCHC: 34.4 g/dL (ref 30.0–36.0)
MCV: 83.6 fL (ref 78.0–100.0)
Platelets: 231 10*3/uL (ref 150–400)
RBC: 4.69 MIL/uL (ref 3.87–5.11)
RDW: 13.5 % (ref 11.5–15.5)
WBC: 6.4 10*3/uL (ref 4.0–10.5)

## 2016-04-22 LAB — D-DIMER, QUANTITATIVE (NOT AT ARMC)

## 2016-04-22 MED ORDER — KETOROLAC TROMETHAMINE 30 MG/ML IJ SOLN
30.0000 mg | Freq: Once | INTRAMUSCULAR | Status: AC
  Administered 2016-04-22: 30 mg via INTRAVENOUS
  Filled 2016-04-22: qty 1

## 2016-04-22 MED ORDER — NAPROXEN 500 MG PO TABS: 500.0000 mg | ORAL_TABLET | Freq: Two times a day (BID) | ORAL | Status: DC

## 2016-04-22 MED ORDER — CYCLOBENZAPRINE HCL 5 MG PO TABS: 5.0000 mg | ORAL_TABLET | Freq: Three times a day (TID) | ORAL | Status: DC | PRN

## 2016-04-22 MED FILL — NAPROXEN 500 MG TABLET: 500 | 15 days supply | Qty: 30 | Fill #0

## 2016-04-22 MED FILL — CYCLOBENZAPRINE 5 MG TABLET: 5 | 6 days supply | Qty: 20 | Fill #0

## 2016-04-22 NOTE — ED Notes (Signed)
CP since last night-states pain is worse with deep breath-NAD-steady gait

## 2016-04-22 NOTE — ED Provider Notes (Signed)
CSN: [id number]     Arrival date & time 04/22/16  1220 History   First MD Initiated Contact with Patient 04/22/16 1250     Chief Complaint  Patient presents with  . Chest Pain     (Consider location/radiation/quality/duration/timing/severity/associated sxs/prior Treatment) HPI  Pt presenting with c/o feeling central chest pain, also some pain in her upper back.  Pain is aching and constant.  Pain began last night.  Pain is worse with movement and different positions.  No difficulty breathing.  No fever/cough.  Pt does smoke cigarettes but has had no change in her cough.  No leg swelling.  No hx of DVT/PE, no recent travel or trauma. Did have cholecystectomy approx 2 months ago.  She has not had any treatment prior to arrival.  There are no other associated systemic symptoms, there are no other alleviating or modifying factors.   Past Medical History  Diagnosis Date  . Diabetes mellitus type 2, noninsulin dependent (HCC)   . Cholecystitis 02/2016  . ADHD (attention deficit hyperactivity disorder)    Past Surgical History  Procedure Laterality Date  . Cesarean section  2011  . Cholecystectomy N/A 02/28/2016    Procedure: LAPAROSCOPIC CHOLECYSTECTOMY;  Surgeon: Abigail Miyamotoouglas Blackman, MD;  Location: [facility];  Service: General;  Laterality: N/A;   No family history on file. Social History  Substance Use Topics  . Smoking status: Current Every Day Smoker -- 1.00 packs/day for 15 years    Types: Cigarettes  . Smokeless tobacco: Never Used  . Alcohol Use: No   OB History    No data available     Review of Systems  ROS reviewed and all otherwise negative except for mentioned in HPI    Allergies  Sulfa antibiotics  Home Medications   Prior to Admission medications   Medication Sig Start Date End Date Taking? Authorizing Provider  buPROPion (WELLBUTRIN XL) 300 MG 24 hr tablet Take 300 mg by mouth daily.   Yes Historical Provider, MD  amphetamine-dextroamphetamine  (ADDERALL) 30 MG tablet Take 30 mg by mouth 2 (two) times daily.    Historical Provider, MD  cyclobenzaprine (FLEXERIL) 5 MG tablet Take 1 tablet (5 mg total) by mouth 3 (three) times daily as needed for muscle spasms. 04/22/16   Jerelyn ScottMartha Linker, MD  famciclovir (FAMVIR) 250 MG tablet Take 250 mg by mouth daily.    Historical Provider, MD  glipiZIDE (GLUCOTROL) 5 MG tablet Take 5 mg by mouth daily before breakfast.    Historical Provider, MD  HYDROcodone-acetaminophen (NORCO/VICODIN) 5-325 MG tablet Take 1-2 tablets by mouth every 4 (four) hours as needed for moderate pain or severe pain. 02/28/16   Abigail Miyamotoouglas Blackman, MD  metFORMIN (GLUCOPHAGE) 1000 MG tablet Take 1,000 mg by mouth 2 (two) times daily with a meal.    Historical Provider, MD  naproxen (NAPROSYN) 500 MG tablet Take 1 tablet (500 mg total) by mouth 2 (two) times daily. 04/22/16   Jerelyn ScottMartha Linker, MD   BP 124/82 mmHg  Pulse 74  Temp(Src) 98.7 F (37.1 C) (Oral)  Resp 17  Ht 5\' 4"  (1.626 m)  Wt 281 lb (127.461 kg)  BMI 48.21 kg/m2  SpO2 96%  LMP 04/12/2016  Vitals reviewed Physical Exam  Physical Examination: General appearance - alert, well appearing, and in no distress Mental status - alert, oriented to person, place, and time Eyes - no conjunctival injection, no scleral icterus Chest - clear to auscultation, no wheezes, rales or rhonchi, symmetric air entry Heart -  normal rate, regular rhythm, normal S1, S2, no murmurs, rubs, clicks or gallops Abdomen - soft, nontender, nondistended, no masses or organomegaly Back exam - full range of motion, ttp over left trapezius distribution, some pain in upper back with ROM of left shoulder Neurological - alert, oriented, normal speech Extremities - peripheral pulses normal, no pedal edema, no clubbing or cyanosis Skin - normal coloration and turgor, no rashes  ED Course  Procedures (including critical care time) Labs Review Labs Reviewed  BASIC METABOLIC PANEL - Abnormal; Notable for the  following:    Glucose, Bld 270 (*)    All other components within normal limits  CBC  TROPONIN I  D-DIMER, QUANTITATIVE (NOT AT [facility])    [facility]  04/22/2016  CLINICAL DATA:  Chest pain for 1 day EXAM: CHEST  2 VIEW COMPARISON:  July 24, 2015 FINDINGS: Lungs are clear. Heart size and pulmonary vascularity are normal. No adenopathy. No pneumothorax. No bone lesions. IMPRESSION: No edema or consolidation. Electronically Signed   By: Bretta Bang III M.D.   On: 04/22/2016 13:11   I have personally reviewed and evaluated these images and lab results as part of my medical decision-making.   EKG Interpretation   Date/Time:  Tuesday Apr 22 2016 12:40:47 EDT Ventricular Rate:  82 PR Interval:  157 QRS Duration: 110 QT Interval:  375 QTC Calculation: 438 R Axis:   83 Text Interpretation:  Sinus rhythm No significant change since last  tracing Confirmed by [facility]  MD, MARTHA [phone]) on 04/22/2016 12:45:11 PM      MDM   Final diagnoses:  Chest wall pain  Upper back pain   Pt presenting with c/o chest pain and upper back pain.  Worse with movement and certain positions.  CXR reassuring.  Labs reassuring including troponin and d-dimer.  Do not need to check 2nd troponin as pain has been constant since last night and this rules out ACS in this patient.  D-dimer negative, doubt PE.  Pt treated with toradol in the ED.  Will treat for muscular pain and advised f/u with PMD.  Discharged with strict return precautions.  Pt agreeable with plan.    1:03 PM went to see patient but she is in xray  Jerelyn Scott, MD 04/22/16 1528

## 2016-04-22 NOTE — Discharge Instructions (Signed)
Return to the ED with any concerns including difficulty breathing, worsening pain, leg swelling, fainting, decreased level of alertness/lethargy, or any other alarming symptoms °

## 2016-05-11 ENCOUNTER — Emergency Department (HOSPITAL_BASED_OUTPATIENT_CLINIC_OR_DEPARTMENT_OTHER)
Admission: EM | Admit: 2016-05-11 | Discharge: 2016-05-11 | Disposition: A | Discharge status: Home / Self Care | Payer: Medicare Other | Attending: Emergency Medicine | Admitting: Emergency Medicine

## 2016-05-11 ENCOUNTER — Emergency Department (HOSPITAL_BASED_OUTPATIENT_CLINIC_OR_DEPARTMENT_OTHER): Payer: Medicare Other

## 2016-05-11 ENCOUNTER — Encounter (HOSPITAL_BASED_OUTPATIENT_CLINIC_OR_DEPARTMENT_OTHER): Payer: Self-pay | Admitting: *Deleted

## 2016-05-11 DIAGNOSIS — Z79899 Other long term (current) drug therapy: Secondary | ICD-10-CM | POA: Insufficient documentation

## 2016-05-11 DIAGNOSIS — F17210 Nicotine dependence, cigarettes, uncomplicated: Secondary | ICD-10-CM | POA: Diagnosis not present

## 2016-05-11 DIAGNOSIS — J209 Acute bronchitis, unspecified: Secondary | ICD-10-CM | POA: Insufficient documentation

## 2016-05-11 DIAGNOSIS — E119 Type 2 diabetes mellitus without complications: Secondary | ICD-10-CM | POA: Diagnosis not present

## 2016-05-11 DIAGNOSIS — R05 Cough: Secondary | ICD-10-CM

## 2016-05-11 DIAGNOSIS — R059 Cough, unspecified: Secondary | ICD-10-CM

## 2016-05-11 DIAGNOSIS — Z7984 Long term (current) use of oral hypoglycemic drugs: Secondary | ICD-10-CM | POA: Insufficient documentation

## 2016-05-11 DIAGNOSIS — R0602 Shortness of breath: Secondary | ICD-10-CM | POA: Diagnosis present

## 2016-05-11 MED ORDER — IPRATROPIUM-ALBUTEROL 0.5-2.5 (3) MG/3ML IN SOLN
RESPIRATORY_TRACT | Status: AC
  Administered 2016-05-11: 3 mL
  Filled 2016-05-11: qty 3

## 2016-05-11 MED ORDER — BENZONATATE 100 MG PO CAPS: 100.0000 mg | ORAL_CAPSULE | Freq: Three times a day (TID) | ORAL | Status: DC

## 2016-05-11 MED ORDER — PREDNISONE 20 MG PO TABS: 20.0000 mg | ORAL_TABLET | Freq: Two times a day (BID) | ORAL | Status: DC

## 2016-05-11 MED ORDER — ALBUTEROL SULFATE (2.5 MG/3ML) 0.083% IN NEBU
INHALATION_SOLUTION | RESPIRATORY_TRACT | Status: AC
  Administered 2016-05-11: 2.5 mg
  Filled 2016-05-11: qty 3

## 2016-05-11 NOTE — Discharge Instructions (Signed)
Acute Bronchitis °Bronchitis is inflammation of the airways that extend from the windpipe into the lungs (bronchi). The inflammation often causes mucus to develop. This leads to a cough, which is the most common symptom of bronchitis.  °In acute bronchitis, the condition usually develops suddenly and goes away over time, usually in a couple weeks. Smoking, allergies, and asthma can make bronchitis worse. Repeated episodes of bronchitis may cause further lung problems.  °CAUSES °Acute bronchitis is most often caused by the same virus that causes a cold. The virus can spread from person to person (contagious) through coughing, sneezing, and touching contaminated objects. °SIGNS AND SYMPTOMS  °· Cough.   °· Fever.   °· Coughing up mucus.   °· Body aches.   °· Chest congestion.   °· Chills.   °· Shortness of breath.   °· Sore throat.   °DIAGNOSIS  °Acute bronchitis is usually diagnosed through a physical exam. Your health care provider will also ask you questions about your medical history. Tests, such as chest X-rays, are sometimes done to rule out other conditions.  °TREATMENT  °Acute bronchitis usually goes away in a couple weeks. Oftentimes, no medical treatment is necessary. Medicines are sometimes given for relief of fever or cough. Antibiotic medicines are usually not needed but may be prescribed in certain situations. In some cases, an inhaler may be recommended to help reduce shortness of breath and control the cough. A cool mist vaporizer may also be used to help thin bronchial secretions and make it easier to clear the chest.  °HOME CARE INSTRUCTIONS °· Get plenty of rest.   °· Drink enough fluids to keep your urine clear or pale yellow (unless you have a medical condition that requires fluid restriction). Increasing fluids may help thin your respiratory secretions (sputum) and reduce chest congestion, and it will prevent dehydration.   °· Take medicines only as directed by your health care provider. °· If  you were prescribed an antibiotic medicine, finish it all even if you start to feel better. °· Avoid smoking and secondhand smoke. Exposure to cigarette smoke or irritating chemicals will make bronchitis worse. If you are a smoker, consider using nicotine gum or skin patches to help control withdrawal symptoms. Quitting smoking will help your lungs heal faster.   °· Reduce the chances of another bout of acute bronchitis by washing your hands frequently, avoiding people with cold symptoms, and trying not to touch your hands to your mouth, nose, or eyes.   °· Keep all follow-up visits as directed by your health care provider.   °SEEK MEDICAL CARE IF: °Your symptoms do not improve after 1 week of treatment.  °SEEK IMMEDIATE MEDICAL CARE IF: °· You develop an increased fever or chills.   °· You have chest pain.   °· You have severe shortness of breath. °· You have bloody sputum.   °· You develop dehydration. °· You faint or repeatedly feel like you are going to pass out. °· You develop repeated vomiting. °· You develop a severe headache. °MAKE SURE YOU:  °· Understand these instructions. °· Will watch your condition. °· Will get help right away if you are not doing well or get worse. °  °This information is not intended to replace advice given to you by your health care provider. Make sure you discuss any questions you have with your health care provider. °  °Document Released: 01/15/2005 Document Revised: 12/29/2014 Document Reviewed: 05/31/2013 °Elsevier Interactive Patient Education ©2016 [employer] ° °Cough, Adult °Coughing is a reflex that clears your throat and your airways. Coughing helps to heal and   protect your lungs. It is normal to cough occasionally, but a cough that happens with other symptoms or lasts a long time may be a sign of a condition that needs treatment. A cough may last only 2-3 weeks (acute), or it may last longer than 8 weeks (chronic). °CAUSES °Coughing is commonly caused by: °· Breathing  in substances that irritate your lungs. °· A viral or bacterial respiratory infection. °· Allergies. °· Asthma. °· Postnasal drip. °· Smoking. °· Acid backing up from the stomach into the esophagus (gastroesophageal reflux). °· Certain medicines. °· Chronic lung problems, including COPD (or rarely, lung cancer). °· Other medical conditions such as heart failure. °HOME CARE INSTRUCTIONS  °Pay attention to any changes in your symptoms. Take these actions to help with your discomfort: °· Take medicines only as told by your health care provider. °¨ If you were prescribed an antibiotic medicine, take it as told by your health care provider. Do not stop taking the antibiotic even if you start to feel better. °¨ Talk with your health care provider before you take a cough suppressant medicine. °· Drink enough fluid to keep your urine clear or pale yellow. °· If the air is dry, use a cold steam vaporizer or humidifier in your bedroom or your home to help loosen secretions. °· Avoid anything that causes you to cough at work or at home. °· If your cough is worse at night, try sleeping in a semi-upright position. °· Avoid cigarette smoke. If you smoke, quit smoking. If you need help quitting, ask your health care provider. °· Avoid caffeine. °· Avoid alcohol. °· Rest as needed. °SEEK MEDICAL CARE IF:  °· You have new symptoms. °· You cough up pus. °· Your cough does not get better after 2-3 weeks, or your cough gets worse. °· You cannot control your cough with suppressant medicines and you are losing sleep. °· You develop pain that is getting worse or pain that is not controlled with pain medicines. °· You have a fever. °· You have unexplained weight loss. °· You have night sweats. °SEEK IMMEDIATE MEDICAL CARE IF: °· You cough up blood. °· You have difficulty breathing. °· Your heartbeat is very fast. °  °This information is not intended to replace advice given to you by your health care provider. Make sure you discuss any  questions you have with your health care provider. °  °Document Released: 06/06/2011 Document Revised: 08/29/2015 Document Reviewed: 02/14/2015 °Elsevier Interactive Patient Education ©2016 [employer] Inc[employer] ° °

## 2016-05-11 NOTE — ED Provider Notes (Signed)
CSN: [id number]     Arrival date & time 05/11/16  1191 History   First MD Initiated Contact with Patient 05/11/16 0809     Chief Complaint  Patient presents with  . Shortness of Breath      HPI  Patient presents for evaluation of cough and congestion. Some tobacco 2 weeks. Treated with Zithromax despite a normal x-ray on the third. A week later, proximal to 10 days ago was given prednisone. Finish her prednisone a few days ago. Seemed to get somewhat better on it. Has relapsed with out. Continues to have cough. Dry nonproductive. No fevers or chills. No peripheral edema. No chest pain. No history of significant heart or lung disease with exception of bronchospasm.  Past Medical History  Diagnosis Date  . Diabetes mellitus type 2, noninsulin dependent (HCC)   . Cholecystitis 02/2016  . ADHD (attention deficit hyperactivity disorder)    Past Surgical History  Procedure Laterality Date  . Cesarean section  2011  . Cholecystectomy N/A 02/28/2016    Procedure: LAPAROSCOPIC CHOLECYSTECTOMY;  Surgeon: Abigail Miyamoto, MD;  Location: [facility];  Service: General;  Laterality: N/A;   No family history on file. Social History  Substance Use Topics  . Smoking status: Current Every Day Smoker -- 1.00 packs/day for 15 years    Types: Cigarettes  . Smokeless tobacco: Never Used  . Alcohol Use: No   OB History    No data available     Review of Systems  Constitutional: Negative for fever, chills, diaphoresis, appetite change and fatigue.  HENT: Negative for mouth sores, sore throat and trouble swallowing.   Eyes: Negative for visual disturbance.  Respiratory: Positive for cough, chest tightness and shortness of breath. Negative for wheezing.   Cardiovascular: Negative for chest pain.  Gastrointestinal: Negative for nausea, vomiting, abdominal pain, diarrhea and abdominal distention.  Endocrine: Negative for polydipsia, polyphagia and polyuria.  Genitourinary: Negative for  dysuria, frequency and hematuria.  Musculoskeletal: Negative for gait problem.  Skin: Negative for color change, pallor and rash.  Neurological: Negative for dizziness, syncope, light-headedness and headaches.  Hematological: Does not bruise/bleed easily.  Psychiatric/Behavioral: Negative for behavioral problems and confusion.      Allergies  Sulfa antibiotics  Home Medications   Prior to Admission medications   Medication Sig Start Date End Date Taking? Authorizing Provider  albuterol (PROVENTIL) (2.5 MG/3ML) 0.083% nebulizer solution Take 2.5 mg by nebulization every 6 (six) hours as needed for wheezing or shortness of breath.   Yes Historical Provider, MD  amphetamine-dextroamphetamine (ADDERALL) 30 MG tablet Take 30 mg by mouth 2 (two) times daily.   Yes Historical Provider, MD  buPROPion (WELLBUTRIN XL) 300 MG 24 hr tablet Take 300 mg by mouth daily.   Yes Historical Provider, MD  famciclovir (FAMVIR) 250 MG tablet Take 250 mg by mouth daily.   Yes Historical Provider, MD  glipiZIDE (GLUCOTROL) 5 MG tablet Take 5 mg by mouth daily before breakfast.   Yes Historical Provider, MD  metFORMIN (GLUCOPHAGE) 1000 MG tablet Take 1,000 mg by mouth 2 (two) times daily with a meal.   Yes Historical Provider, MD  PRESCRIPTION MEDICATION Prescription cough meds.   Yes Historical Provider, MD  benzonatate (TESSALON) 100 MG capsule Take 1 capsule (100 mg total) by mouth every 8 (eight) hours. 05/11/16   Rolland Porter, MD  cyclobenzaprine (FLEXERIL) 5 MG tablet Take 1 tablet (5 mg total) by mouth 3 (three) times daily as needed for muscle spasms. 04/22/16  Jerelyn ScottMartha Linker, MD  HYDROcodone-acetaminophen (NORCO/VICODIN) 5-325 MG tablet Take 1-2 tablets by mouth every 4 (four) hours as needed for moderate pain or severe pain. 02/28/16   Abigail Miyamotoouglas Blackman, MD  naproxen (NAPROSYN) 500 MG tablet Take 1 tablet (500 mg total) by mouth 2 (two) times daily. 04/22/16   Jerelyn ScottMartha Linker, MD  predniSONE (DELTASONE) 20 MG  tablet Take 1 tablet (20 mg total) by mouth 2 (two) times daily with a meal. 05/11/16   Rolland PorterMark Hawk Mones, MD   BP 124/69 mmHg  Pulse 79  Temp(Src) 98 F (36.7 C) (Oral)  Resp 16  Ht 5\' 4"  (1.626 m)  Wt 280 lb (127.007 kg)  BMI 48.04 kg/m2  SpO2 99%  LMP 05/03/2016 Physical Exam  Constitutional: She is oriented to person, place, and time. She appears well-developed and well-nourished. No distress.  HENT:  Head: Normocephalic.  Eyes: Conjunctivae are normal. Pupils are equal, round, and reactive to light. No scleral icterus.  Neck: Normal range of motion. Neck supple. No thyromegaly present.  Cardiovascular: Normal rate and regular rhythm.  Exam reveals no gallop and no friction rub.   No murmur heard. Pulmonary/Chest: Effort normal. No respiratory distress. She has wheezes. She has no rales.  Abdominal: Soft. Bowel sounds are normal. She exhibits no distension. There is no tenderness. There is no rebound.  Musculoskeletal: Normal range of motion.  Neurological: She is alert and oriented to person, place, and time.  Skin: Skin is warm and dry. No rash noted.  Psychiatric: She has a normal mood and affect. Her behavior is normal.    ED Course  Procedures (including critical care time) Labs Review Labs Reviewed - No data to display  Imaging Review Dg Chest 2 View  05/11/2016  CLINICAL DATA:  Substernal chest pain for 2 weeks. Shortness of breath and dry cough. Current smoker. EXAM: CHEST  2 VIEW COMPARISON:  04/22/2016; 07/24/2015 FINDINGS: Grossly unchanged cardiac silhouette and mediastinal contours. No focal parenchymal opacities. No pleural effusion pneumothorax. No evidence of edema. No acute osseus abnormalities. IMPRESSION: No acute cardiopulmonary disease. Specifically, no evidence of pneumonia. Electronically Signed   By: Simonne ComeJohn  Watts M.D.   On: 05/11/2016 09:43   I have personally reviewed and evaluated these images and lab results as part of my medical decision-making.   EKG  Interpretation None      MDM   Final diagnoses:  Acute bronchitis, unspecified organism  Cough   Not hypoxemic or febrile. Feels and sounds better after nebulized up-year-old. Chest x-ray shows no significant abnormalities. IUD no fluid, cardiac megaly. No infiltrates. Plan is home, she has codeine based cough syrup at home. Has albuterol at home. Prescription prednisone, Tessalon, expectant management. No indication for antibiotics.   Rolland PorterMark Nikisha Fleece, MD 05/11/16 [phone]

## 2016-05-11 NOTE — ED Notes (Signed)
Patient states she developed a congested chest cold and cough approximately 2 weeks ago.  States she was seen at Urgent Care and given a Z-pack.  States one week later she returned to Urgent Care and was started on Prednisone. States she continues to have chest tightness and sob.

## 2016-09-05 ENCOUNTER — Emergency Department (HOSPITAL_BASED_OUTPATIENT_CLINIC_OR_DEPARTMENT_OTHER)
Admission: EM | Admit: 2016-09-05 | Discharge: 2016-09-06 | Disposition: A | Discharge status: Home / Self Care | Payer: Medicare Other | Attending: Emergency Medicine | Admitting: Emergency Medicine

## 2016-09-05 ENCOUNTER — Encounter (HOSPITAL_BASED_OUTPATIENT_CLINIC_OR_DEPARTMENT_OTHER): Payer: Self-pay | Admitting: *Deleted

## 2016-09-05 DIAGNOSIS — E119 Type 2 diabetes mellitus without complications: Secondary | ICD-10-CM | POA: Insufficient documentation

## 2016-09-05 DIAGNOSIS — R195 Other fecal abnormalities: Secondary | ICD-10-CM | POA: Diagnosis present

## 2016-09-05 DIAGNOSIS — F17210 Nicotine dependence, cigarettes, uncomplicated: Secondary | ICD-10-CM | POA: Insufficient documentation

## 2016-09-05 DIAGNOSIS — Z7984 Long term (current) use of oral hypoglycemic drugs: Secondary | ICD-10-CM | POA: Diagnosis not present

## 2016-09-05 DIAGNOSIS — B663 Fascioliasis: Secondary | ICD-10-CM

## 2016-09-05 DIAGNOSIS — F909 Attention-deficit hyperactivity disorder, unspecified type: Secondary | ICD-10-CM | POA: Insufficient documentation

## 2016-09-05 NOTE — ED Triage Notes (Signed)
Pt c/o "worms in stool" x 1 day

## 2016-09-05 NOTE — ED Provider Notes (Signed)
[facility] Provider Note: Mary Dell, MD, FACEP  CSN: [id number] MRN: [id number] ARRIVAL: 09/05/16 at 2319  By signing my name below, I, Mary Novak, attest that this documentation has been prepared under the direction and in the presence of Mary Libra, MD. Electronically Signed: Bridgette Novak, ED Scribe. 09/05/16. 11:37 PM.  CHIEF COMPLAINT  Worms in Stool   HISTORY OF PRESENT ILLNESS  HPI Comments: Mary Novak is a 40 y.o. female who presents to the Emergency Department for "red sticks" in her stool earlier today. Pt states she thinks they are worms or parasites. 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   She states she has an extensive h/o abdominal problems and chronic yeast infections. She underwent a cholecystectomy earlier this year. She is not sure if she has had any anal itching which she can attribute to these suspected parasites. Pt reports she has not eaten anything that looks similar to what presents in her stool. No alleviating factors noted. Pt denies fever.    Past Medical History:  Diagnosis Date  . ADHD (attention deficit hyperactivity disorder)   . Cholecystitis 02/2016  . Diabetes mellitus type 2, noninsulin dependent [facility])     Past Surgical History:  Procedure Laterality Date  . CESAREAN SECTION  2011  . CHOLECYSTECTOMY N/A 02/28/2016   Procedure: LAPAROSCOPIC CHOLECYSTECTOMY;  Surgeon: Abigail Miyamoto, MD;  Location:  SURGERY CENTER;  Service: General;  Laterality: N/A;    History reviewed. No pertinent family history.  Social History  Substance Use Topics  . Smoking status: Current Every Day Smoker    Packs/day: 1.00    Years: 15.00    Types: Cigarettes  . Smokeless tobacco: Never Used  . Alcohol use No    Prior to Admission medications   Medication Sig Start Date End Date Taking? Authorizing Provider  albuterol (PROVENTIL) (2.5 MG/3ML) 0.083% nebulizer solution  Take 2.5 mg by nebulization every 6 (six) hours as needed for wheezing or shortness of breath.    Historical Provider, MD  amphetamine-dextroamphetamine (ADDERALL) 30 MG tablet Take 30 mg by mouth 2 (two) times daily.    Historical Provider, MD  benzonatate (TESSALON) 100 MG capsule Take 1 capsule (100 mg total) by mouth every 8 (eight) hours. 05/11/16   Rolland Porter, MD  buPROPion (WELLBUTRIN XL) 300 MG 24 hr tablet Take 300 mg by mouth daily.    Historical Provider, MD  cyclobenzaprine (FLEXERIL) 5 MG tablet Take 1 tablet (5 mg total) by mouth 3 (three) times daily as needed for muscle spasms. 04/22/16   Jerelyn Scott, MD  famciclovir (FAMVIR) 250 MG tablet Take 250 mg by mouth daily.    Historical Provider, MD  glipiZIDE (GLUCOTROL) 5 MG tablet Take 5 mg by mouth daily before breakfast.    Historical Provider, MD  HYDROcodone-acetaminophen (NORCO/VICODIN) 5-325 MG tablet Take 1-2 tablets by mouth every 4 (four) hours as needed for moderate pain or severe pain. 02/28/16   Abigail Miyamoto, MD  metFORMIN (GLUCOPHAGE) 1000 MG tablet Take 1,000 mg by mouth 2 (two) times daily with a meal.    Historical Provider, MD  naproxen (NAPROSYN) 500 MG tablet Take 1 tablet (500 mg total) by mouth 2 (two) times daily. 04/22/16   Jerelyn Scott, MD  predniSONE (DELTASONE) 20 MG tablet Take 1 tablet (20 mg total) by mouth 2 (two) times daily with a meal. 05/11/16   Rolland Porter, MD  PRESCRIPTION MEDICATION Prescription cough meds.  Historical Provider, MD    Allergies Sulfa antibiotics   REVIEW OF SYSTEMS  Negative except as noted here or in the History of Present Illness.   PHYSICAL EXAMINATION  Initial Vital Signs There were no vitals taken for this visit.  Examination General: Well-developed, obese female in no acute distress; appearance consistent with age of record HENT: normocephalic; atraumatic Eyes: pupils equal, round and reactive to light; extraocular muscles intact Neck: supple Heart: regular rate  and rhythm Lungs: clear to auscultation bilaterally Abdomen: soft; obese; RUQ tenderness; no masses or hepatosplenomegaly; bowel sounds present Extremities: No deformity; full range of motion; pulses normal Neurologic: Awake, alert and oriented; motor function intact in all extremities and symmetric; no facial droop Skin: Warm and dry Psychiatric: Normal mood and affect   RESULTS  Summary of this visit's results, reviewed by myself:   EKG Interpretation  Date/Time:    Ventricular Rate:    PR Interval:    QRS Duration:   QT Interval:    QTC Calculation:   R Axis:     Text Interpretation:        Laboratory Studies: No results found for this or any previous visit (from the past 24 hour(s)). Imaging Studies: No results found.  ED COURSE  Nursing notes and initial vitals signs, including pulse oximetry, reviewed.  The specimen supplied does resemble liver fluke cyst seen on the Internet. These have been forwarded to the laboratory for definitive identification. The preferred treatment for liver fluke infection, triclabendazole, is not readily available in pharmacies. We will wait for definitive diagnosis and have her follow-up with the [facility] for definitive treatment if liver fluke infection is confirmed.  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 The recorded information has been reviewed and is accurate.     Mary LibraJohn Kathalene Sporer, MD 09/06/16 0002

## 2016-09-05 NOTE — ED Notes (Signed)
Pt alert, NAD, calm, interactive, resps e/u, speaking in clear complete sentences, c/o abd pain/fullness, rectal itching, lack of appetite, nausea, diarrhea, (denies: vomiting, fever, constipation, dizziness, bleeding or other sx), describes diarrhea as soft/loose with worms.

## 2016-09-05 NOTE — Discharge Instructions (Signed)
Your specimens have been sent to the lab for identification. If these are in fact a parasite will be contacted. Treatment should be managed by the [facility] and you have been given contact information for their office.

## 2016-09-09 LAB — OVA + PARASITE EXAM

## 2016-09-09 LAB — O&P RESULT

## 2016-12-15 IMAGING — DX DG CHEST 2V
2 series · 2 of 2 positions shown · non-contrast
Comparison: 04/22/2016; 07/24/2015

CLINICAL DATA: Substernal chest pain for 2 weeks. Shortness of
breath and dry cough. Current smoker.

EXAM:
CHEST  2 VIEW

[chest pa]
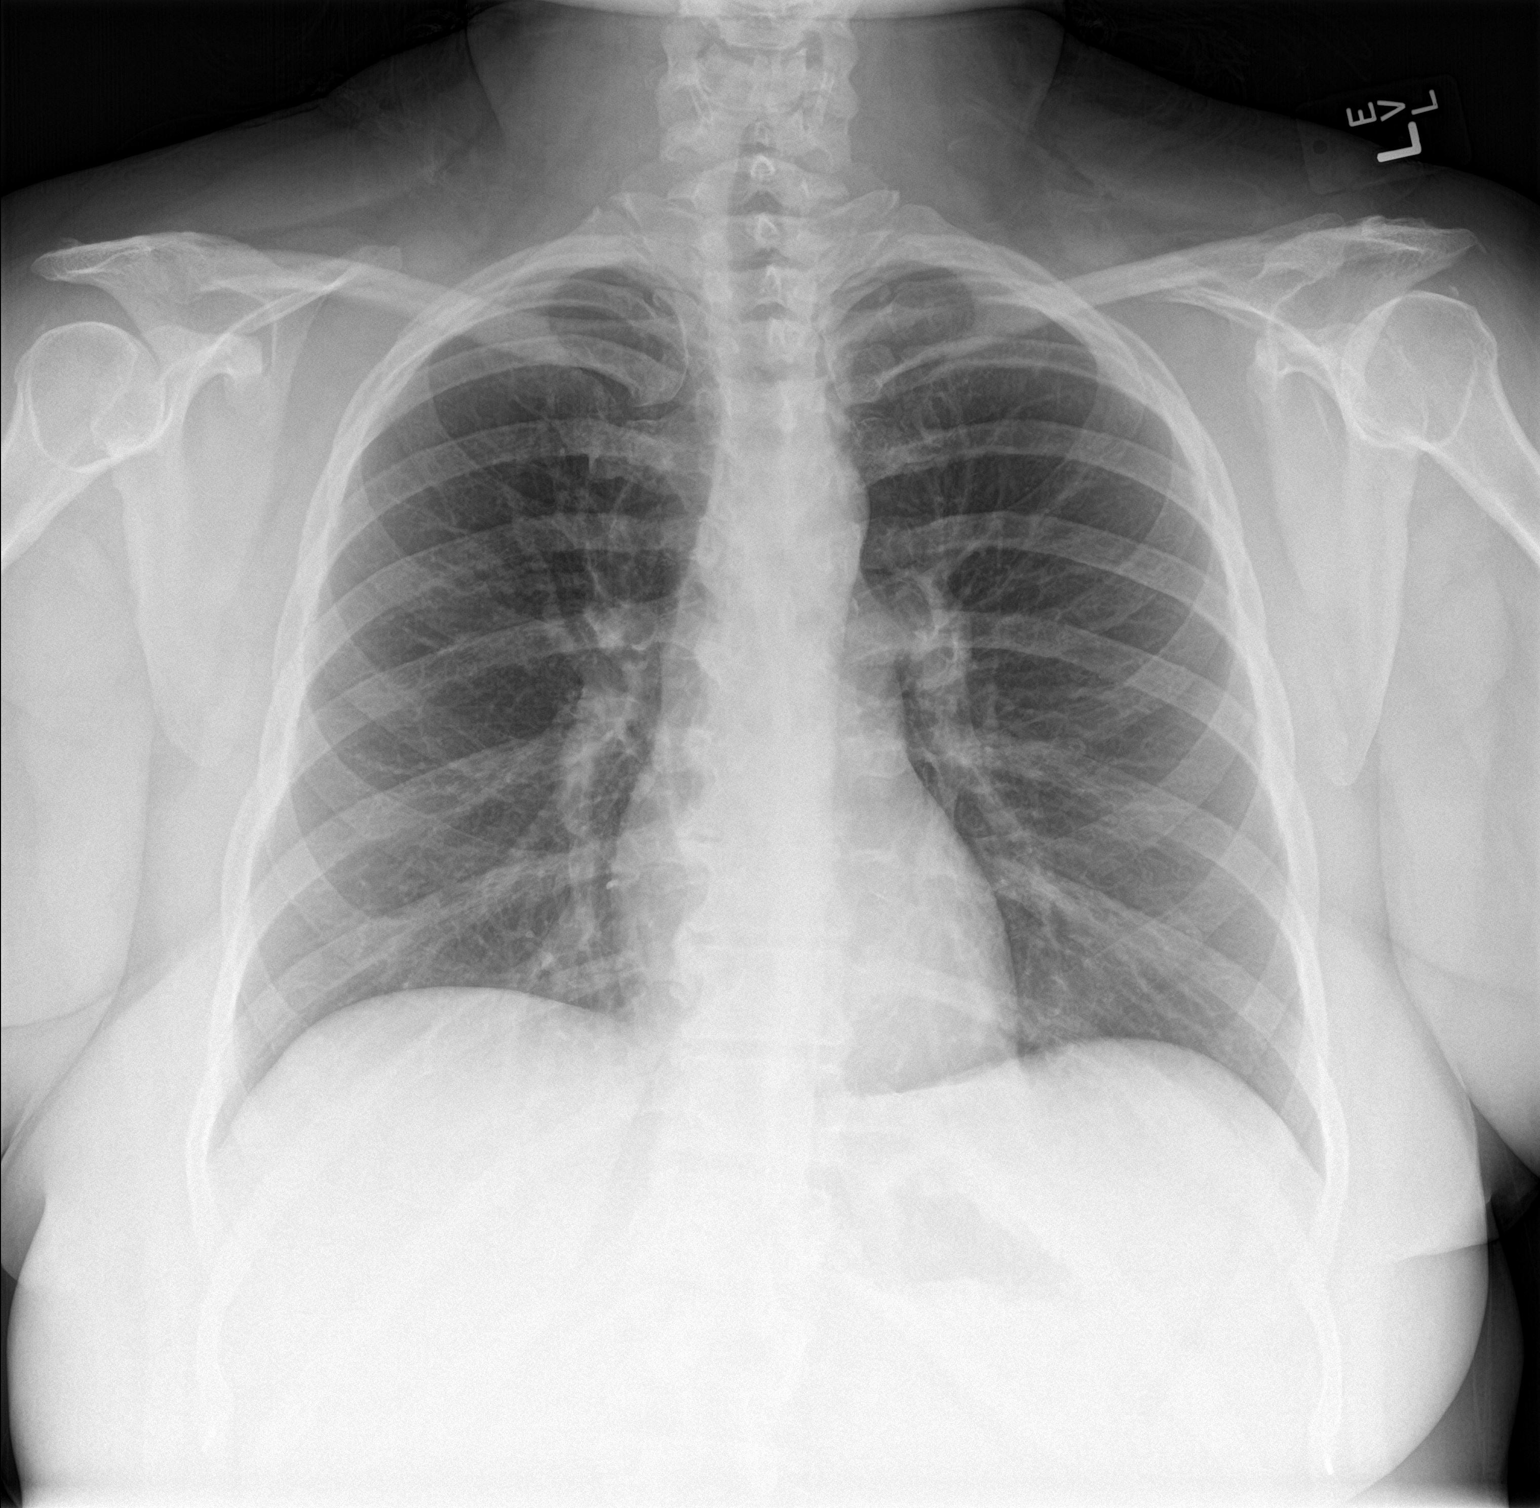

[chest lat]
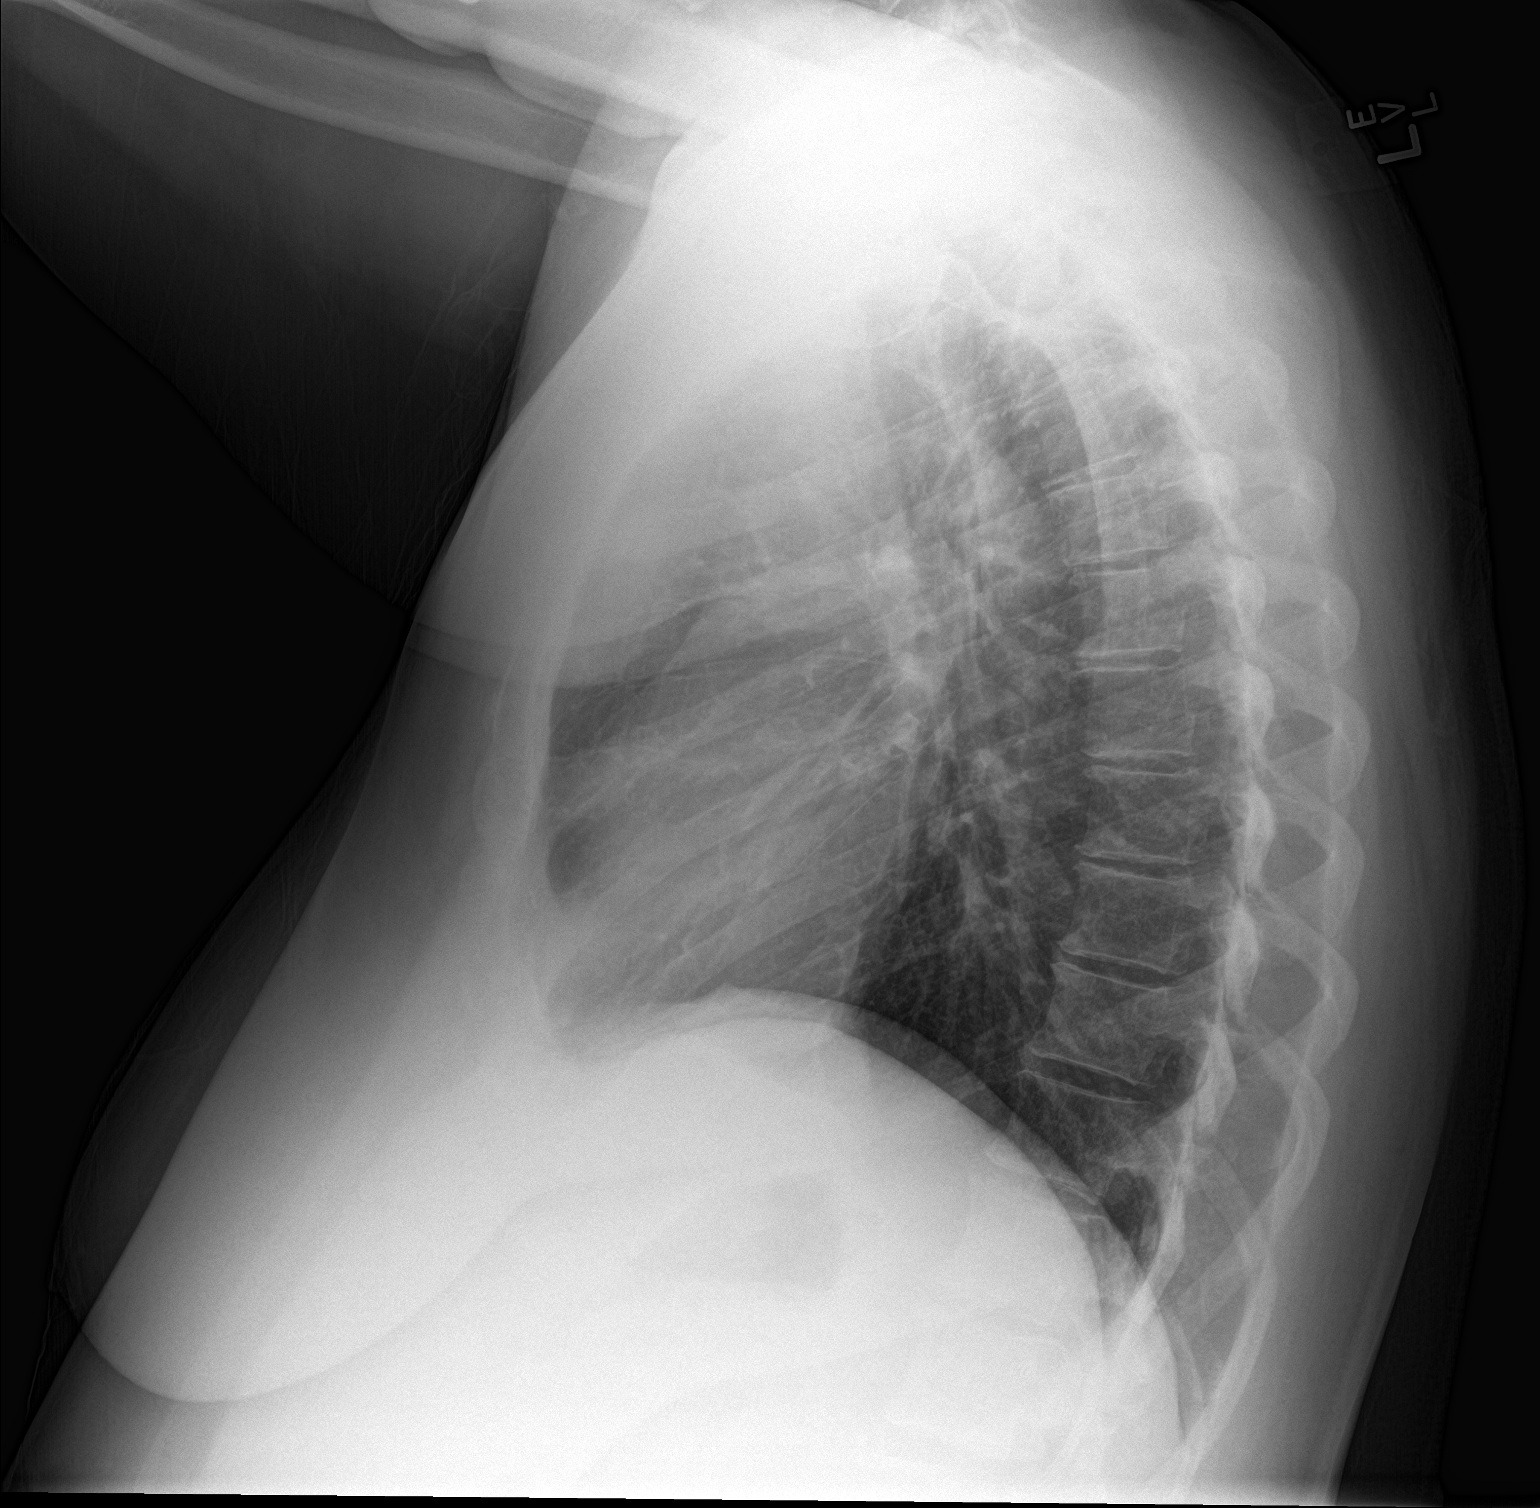

[2 of 2 positions shown; findings below may reference images not displayed]

FINDINGS: Grossly unchanged cardiac silhouette and mediastinal contours. No
focal parenchymal opacities. No pleural effusion pneumothorax. No
evidence of edema. No acute osseus abnormalities.
IMPRESSION: No acute cardiopulmonary disease. Specifically, no evidence of
pneumonia.

## 2017-05-09 ENCOUNTER — Emergency Department (HOSPITAL_BASED_OUTPATIENT_CLINIC_OR_DEPARTMENT_OTHER)
Admission: EM | Admit: 2017-05-09 | Discharge: 2017-05-10 | Disposition: A | Discharge status: Home / Self Care | Payer: Medicare Other | Attending: Emergency Medicine | Admitting: Emergency Medicine

## 2017-05-09 ENCOUNTER — Encounter (HOSPITAL_BASED_OUTPATIENT_CLINIC_OR_DEPARTMENT_OTHER): Payer: Self-pay | Admitting: Emergency Medicine

## 2017-05-09 DIAGNOSIS — M5432 Sciatica, left side: Secondary | ICD-10-CM | POA: Insufficient documentation

## 2017-05-09 DIAGNOSIS — B3731 Acute candidiasis of vulva and vagina: Secondary | ICD-10-CM

## 2017-05-09 DIAGNOSIS — Z79899 Other long term (current) drug therapy: Secondary | ICD-10-CM | POA: Insufficient documentation

## 2017-05-09 DIAGNOSIS — E119 Type 2 diabetes mellitus without complications: Secondary | ICD-10-CM | POA: Insufficient documentation

## 2017-05-09 DIAGNOSIS — M545 Low back pain: Secondary | ICD-10-CM | POA: Diagnosis present

## 2017-05-09 DIAGNOSIS — B373 Candidiasis of vulva and vagina: Secondary | ICD-10-CM | POA: Diagnosis not present

## 2017-05-09 DIAGNOSIS — F17210 Nicotine dependence, cigarettes, uncomplicated: Secondary | ICD-10-CM | POA: Diagnosis not present

## 2017-05-09 LAB — URINALYSIS, ROUTINE W REFLEX MICROSCOPIC
BILIRUBIN URINE: NEGATIVE
Hgb urine dipstick: NEGATIVE
KETONES UR: 15 mg/dL — AB
LEUKOCYTES UA: NEGATIVE
NITRITE: NEGATIVE
PH: 5.5 (ref 5.0–8.0)
Protein, ur: NEGATIVE mg/dL
Specific Gravity, Urine: 1.044 — ABNORMAL HIGH (ref 1.005–1.030)

## 2017-05-09 LAB — URINALYSIS, MICROSCOPIC (REFLEX)

## 2017-05-09 LAB — CBG MONITORING, ED: GLUCOSE-CAPILLARY: 224 mg/dL — AB (ref 65–99)

## 2017-05-09 NOTE — ED Provider Notes (Signed)
By signing my name below, I, Bing Neighbors., attest that this documentation has been prepared under the direction and in the presence of Marcellina Jonsson, Layla Maw, DO. Electronically signed: Bing Neighbors., ED Scribe. 05/09/17. 11:50 PM.   TIME SEEN: 11:44 PM  CHIEF COMPLAINT: L lower back pain  HPI:  Mary Novak is a 41 y.o. female with hx of Type 2 diabetes mellitus, recurrent yeast infection who presents to the Emergency Department complaining of L lower back pain with onset x1 day. Pt states that for the past x1 day she has had L lower back pain that radiates down the L leg. She describes the pain as dull. Pt has taken 2 Advil tablets every x6 hours with no relief. She denies numbness, weakness, bladder/bowel incontinence, urinary retention, fever. Of note, pt takes Janumet for diabetes.  Pt's last menstrual was x3 months ago as she has Depo. Pt reportedly finished her Levaquin x2 days ago for a UTI.  Reports that this chronically gives her a yeast infection. No abnormal vaginal bleeding or discharge. Is having some itching of the labia and states she has "scratched myself raw".  Has Diflucan at home but has not yet taken it.  ROS: See HPI Constitutional: no fever  Eyes: no drainage  ENT: no runny nose   Cardiovascular:  no chest pain  Resp: no SOB  GI: no vomiting GU: no dysuria, no bladder/bowel incontinence, no urinary retention Integumentary: no rash  Allergy: no hives  Musculoskeletal: + L lower back pain, no leg swelling  Neurological: no slurred speech, no numbness, no weakness ROS otherwise negative  PAST MEDICAL HISTORY/PAST SURGICAL HISTORY:  Past Medical History:  Diagnosis Date  . ADHD (attention deficit hyperactivity disorder)   . Cholecystitis 02/2016  . Diabetes mellitus type 2, noninsulin dependent (HCC)     MEDICATIONS:  Prior to Admission medications   Medication Sig Start Date End Date Taking? Authorizing Provider  sitaGLIPtin-metformin  (JANUMET) 50-1000 MG tablet Take 1 tablet by mouth 2 (two) times daily with a meal.   Yes [provider]  albuterol (PROVENTIL) (2.5 MG/3ML) 0.083% nebulizer solution Take 2.5 mg by nebulization every 6 (six) hours as needed for wheezing or shortness of breath.    [provider]  amphetamine-dextroamphetamine (ADDERALL) 30 MG tablet Take 30 mg by mouth 2 (two) times daily.    [provider]  benzonatate (TESSALON) 100 MG capsule Take 1 capsule (100 mg total) by mouth every 8 (eight) hours. 05/11/16   Rolland Porter, MD  buPROPion (WELLBUTRIN XL) 300 MG 24 hr tablet Take 300 mg by mouth daily.    [provider]  cyclobenzaprine (FLEXERIL) 5 MG tablet Take 1 tablet (5 mg total) by mouth 3 (three) times daily as needed for muscle spasms. 04/22/16   Jerelyn Scott, MD  famciclovir (FAMVIR) 250 MG tablet Take 250 mg by mouth daily.    [provider]  glipiZIDE (GLUCOTROL) 5 MG tablet Take 5 mg by mouth daily before breakfast.    [provider]  HYDROcodone-acetaminophen (NORCO/VICODIN) 5-325 MG tablet Take 1-2 tablets by mouth every 4 (four) hours as needed for moderate pain or severe pain. 02/28/16   Abigail Miyamoto, MD  metFORMIN (GLUCOPHAGE) 1000 MG tablet Take 1,000 mg by mouth 2 (two) times daily with a meal.    [provider]  naproxen (NAPROSYN) 500 MG tablet Take 1 tablet (500 mg total) by mouth 2 (two) times daily. 04/22/16   Jerelyn Scott, MD  predniSONE (DELTASONE) 20 MG tablet Take 1 tablet (20 mg total) by mouth 2 (two) times daily with a meal. 05/11/16   Rolland PorterJames, Mark, MD  PRESCRIPTION MEDICATION Prescription cough meds.    [provider]    ALLERGIES:  Allergies  Allergen Reactions  . Sulfa Antibiotics Rash    SOCIAL HISTORY:  Social History  Substance Use Topics  . Smoking status: Current Every Day Smoker    Packs/day: 1.00    Years: 15.00    Types: Cigarettes  . Smokeless tobacco: Never Used  . Alcohol use  No    FAMILY HISTORY: History reviewed. No pertinent family history.  EXAM: BP 140/89 (BP Location: Right Arm)   Pulse (!) 116   Temp 99.3 F (37.4 C) (Oral)   Resp 20   Ht 5\' 4"  (1.626 m)   Wt 280 lb (127 kg)   LMP 02/18/2017   SpO2 100%   BMI 48.06 kg/m  CONSTITUTIONAL: Obese, Alert and oriented and responds appropriately to questions. Well-appearing; well-nourished HEAD: Normocephalic EYES: Conjunctivae clear, pupils appear equal, EOMI ENT: normal nose; moist mucous membranes NECK: Supple, no meningismus, no nuchal rigidity, no LAD  CARD: RRR; S1 and S2 appreciated; no murmurs, no clicks, no rubs, no gallops RESP: Normal chest excursion without splinting or tachypnea; breath sounds clear and equal bilaterally; no wheezes, no rhonchi, no rales, no hypoxia or respiratory distress, speaking full sentences ABD/GI: Normal bowel sounds; non-distended; soft, non-tender, no rebound, no guarding, no peritoneal signs, no hepatosplenomegaly GU:  Patient has inflamed, slightly erythematous bilateral labia majora with excoriations. There is no bleeding or vaginal discharge. No other lesions noted. BACK:  The back appears normal and is tender to palpation over the left SI joint without erythema, swelling or warmth. Also tender over the left lower lumbar paraspinal musculature but there is no midline spinal tenderness, step-off or deformity. There is no CVA tenderness EXT: Normal ROM in all joints; non-tender to palpation; no edema; normal capillary refill; no cyanosis, no calf tenderness or swelling    SKIN: Normal color for age and race; warm; no rash NEURO: Moves all extremities equally; Strength 5/5 in all four extremities.  Normal sensation diffusely.  CN 2-12 grossly intact.  Normal speech.  Normal gait. PSYCH: The patient's mood and manner are appropriate. Grooming and personal hygiene are appropriate.  MEDICAL DECISION MAKING: Patient here symptoms of sciatica. Nothing on her exam or  history to suggest cauda equina, spinal stenosis, epidural abscess or hematoma, discitis, transverse myelitis. No history of injury to suggest fracture. I do not feel she needs emergent imaging. Treat symptomatically with pain medication. States she would like to drive herself home and therefore will prescribe her prescription for pain medication. She declines Toradol here. We'll hold off on steroids given she is a diabetic.  Have recommended close follow-up with her primary care provider.   Patient's blood sugar mildly elevated. She reports she is supposed to be on 2 medications for her diabetes but has run out of one of them but will get it refilled by her endocrinologist next week. Her urine shows no obvious sign of infection at this time. She has no symptoms to suggest DKA. On exam patient does appear to have yeast vaginitis. She has Diflucan at home which I advised her to take. She has excoriations noted around the external genitalia but no sign of superimposed infection.    At this time, I do not feel there is any life-threatening condition present. I have reviewed and  discussed all results (EKG, imaging, lab, urine as appropriate) and exam findings with patient/family. I have reviewed nursing notes and appropriate previous records.  I feel the patient is safe to be discharged home without further emergent workup and can continue workup as an outpatient as needed. Discussed usual and customary return precautions. Patient/family verbalize understanding and are comfortable with this plan.  Outpatient follow-up has been provided if needed. All questions have been answered.       Islam Villescas, Layla Maw, DO 05/10/17 [phone]

## 2017-05-09 NOTE — ED Triage Notes (Signed)
Patient states that she started to have left side lower back pain and hip pain radiating down into her left leg, patient reports that her pain is worse when she lifts her left leg. Patient states that she is also having itching and her perineum area is raw from a possible yeast infection. Patient recently was on an antibiotic and is a diabetic

## 2017-05-10 MED ORDER — IBUPROFEN 800 MG PO TABS: 800.0000 mg | ORAL_TABLET | Freq: Three times a day (TID) | ORAL | 0 refills | Status: AC | PRN

## 2017-05-10 MED ORDER — OXYCODONE-ACETAMINOPHEN 5-325 MG PO TABS: 1.0000 | ORAL_TABLET | Freq: Four times a day (QID) | ORAL | 0 refills | Status: DC | PRN

## 2017-05-10 NOTE — Discharge Instructions (Signed)
Take your Diflucan tonight.

## 2017-05-10 NOTE — ED Notes (Signed)
Pt discharged to home with family. NAD.  

## 2017-05-11 ENCOUNTER — Emergency Department (HOSPITAL_BASED_OUTPATIENT_CLINIC_OR_DEPARTMENT_OTHER)
Admission: EM | Admit: 2017-05-11 | Discharge: 2017-05-11 | Disposition: A | Discharge status: Home / Self Care | Payer: Medicare Other | Attending: Physician Assistant | Admitting: Physician Assistant

## 2017-05-11 ENCOUNTER — Emergency Department (HOSPITAL_BASED_OUTPATIENT_CLINIC_OR_DEPARTMENT_OTHER): Payer: Medicare Other

## 2017-05-11 ENCOUNTER — Encounter (HOSPITAL_BASED_OUTPATIENT_CLINIC_OR_DEPARTMENT_OTHER): Payer: Self-pay | Admitting: Emergency Medicine

## 2017-05-11 DIAGNOSIS — M5432 Sciatica, left side: Secondary | ICD-10-CM

## 2017-05-11 DIAGNOSIS — Z7984 Long term (current) use of oral hypoglycemic drugs: Secondary | ICD-10-CM | POA: Diagnosis not present

## 2017-05-11 DIAGNOSIS — E119 Type 2 diabetes mellitus without complications: Secondary | ICD-10-CM | POA: Diagnosis not present

## 2017-05-11 DIAGNOSIS — Z79899 Other long term (current) drug therapy: Secondary | ICD-10-CM | POA: Diagnosis not present

## 2017-05-11 DIAGNOSIS — M5442 Lumbago with sciatica, left side: Secondary | ICD-10-CM | POA: Insufficient documentation

## 2017-05-11 DIAGNOSIS — F17210 Nicotine dependence, cigarettes, uncomplicated: Secondary | ICD-10-CM | POA: Insufficient documentation

## 2017-05-11 DIAGNOSIS — K5900 Constipation, unspecified: Secondary | ICD-10-CM | POA: Insufficient documentation

## 2017-05-11 DIAGNOSIS — M545 Low back pain: Secondary | ICD-10-CM | POA: Diagnosis present

## 2017-05-11 DIAGNOSIS — F909 Attention-deficit hyperactivity disorder, unspecified type: Secondary | ICD-10-CM | POA: Insufficient documentation

## 2017-05-11 LAB — URINALYSIS, ROUTINE W REFLEX MICROSCOPIC
BILIRUBIN URINE: NEGATIVE
Hgb urine dipstick: NEGATIVE
KETONES UR: NEGATIVE mg/dL
NITRITE: NEGATIVE
PH: 5 (ref 5.0–8.0)
Protein, ur: NEGATIVE mg/dL
SPECIFIC GRAVITY, URINE: 1.031 — AB (ref 1.005–1.030)

## 2017-05-11 LAB — URINALYSIS, MICROSCOPIC (REFLEX): RBC / HPF: NONE SEEN RBC/hpf (ref 0–5)

## 2017-05-11 LAB — PREGNANCY, URINE: Preg Test, Ur: NEGATIVE

## 2017-05-11 MED ORDER — OXYCODONE-ACETAMINOPHEN 5-325 MG PO TABS
1.0000 | ORAL_TABLET | Freq: Once | ORAL | Status: AC
  Administered 2017-05-11: 1 via ORAL
  Filled 2017-05-11: qty 1

## 2017-05-11 MED ORDER — METHYLPREDNISOLONE 4 MG PO TBPK: ORAL_TABLET | ORAL | 0 refills | Status: DC

## 2017-05-11 MED ORDER — KETOROLAC TROMETHAMINE 30 MG/ML IJ SOLN
30.0000 mg | Freq: Once | INTRAMUSCULAR | Status: AC
  Administered 2017-05-11: 30 mg via INTRAMUSCULAR
  Filled 2017-05-11: qty 1

## 2017-05-11 NOTE — ED Notes (Signed)
ED Provider at bedside. 

## 2017-05-11 NOTE — ED Triage Notes (Signed)
Patient was here 2 days ago for the same, the patient states that she went to an urgent care today and they did an xray but was told to come here because they were unable to give her anything for pain. The patient is crying in pain and states that she can no longer take the pain, the patient reports that this is the worst that she had ever had

## 2017-05-11 NOTE — Discharge Instructions (Signed)
You have pain to her back that could be consistent with sciatica. Please use lidocaine patches,(klidocaine patches are called Salon Pas)  heat, stretches, and the steroid Dosepak to help with her symptoms. Please return to your having weakness, urinary or bladder symptoms, fever or other concerns.

## 2017-05-11 NOTE — ED Notes (Signed)
Pt was able to ambulate to the bathroom with no assistance.

## 2017-05-11 NOTE — ED Provider Notes (Signed)
[facility] Provider Note   CSN: [id number] Arrival date & time: 05/11/17  1357  By signing my name below, I, Linna Darner, attest that this documentation has been prepared under the direction and in the presence of physician practitioner, Corlis Leak, Cindee Salt, MD. Electronically Signed: Linna Darner, Scribe. 05/11/2017. 3:48 PM.  History   Chief Complaint Chief Complaint  Patient presents with  . Back Pain   The history is provided by the patient. No language interpreter was used.    HPI Comments: Mary Novak is a 41 y.o. female with PMHx including DM2 who presents to the Emergency Department complaining of constant, gradually worsening, severe, non-radiating left lower back pain beginning two days ago. She states her pain is dull at rest and sharp/stabbing with movement. Patient notes some associated intermittent tingling in her bilateral feet as well as some mild constipation. She has tried ibuprofen without any significant improvement of her pain. She was evaluated here two days ago for the same and was discharged without imaging or medications. Patient was also evaluated at Urgent Care earlier today for the same; she had imaging of her back that was negative and was advised to come to the ED for further evaluation. No h/o IVDU. Patient denies fevers, chills, nausea, vomiting, focal weakness, urinary/bowel incontinence, dysuria, or any other associated symptoms.  Per the [place] Controlled Substances Database, patient last received Ritalin one year ago and has not received any narcotics since then.  Past Medical History:  Diagnosis Date  . ADHD (attention deficit hyperactivity disorder)   . Cholecystitis 02/2016  . Diabetes mellitus type 2, noninsulin dependent (HCC)     There are no active problems to display for this patient.   Past Surgical History:  Procedure Laterality Date  . CESAREAN SECTION  2011  . CHOLECYSTECTOMY N/A 02/28/2016   Procedure:  LAPAROSCOPIC CHOLECYSTECTOMY;  Surgeon: Abigail Miyamoto, MD;  Location: [facility];  Service: General;  Laterality: N/A;    OB History    No data available       Home Medications    Prior to Admission medications   Medication Sig Start Date End Date Taking? Authorizing Provider  albuterol (PROVENTIL) (2.5 MG/3ML) 0.083% nebulizer solution Take 2.5 mg by nebulization every 6 (six) hours as needed for wheezing or shortness of breath.    [provider]  amphetamine-dextroamphetamine (ADDERALL) 30 MG tablet Take 30 mg by mouth 2 (two) times daily.    [provider]  benzonatate (TESSALON) 100 MG capsule Take 1 capsule (100 mg total) by mouth every 8 (eight) hours. 05/11/16   Rolland Porter, MD  buPROPion (WELLBUTRIN XL) 300 MG 24 hr tablet Take 300 mg by mouth daily.    [provider]  cyclobenzaprine (FLEXERIL) 5 MG tablet Take 1 tablet (5 mg total) by mouth 3 (three) times daily as needed for muscle spasms. 04/22/16   Jerelyn Scott, MD  famciclovir (FAMVIR) 250 MG tablet Take 250 mg by mouth daily.    [provider]  glipiZIDE (GLUCOTROL) 5 MG tablet Take 5 mg by mouth daily before breakfast.    [provider]  HYDROcodone-acetaminophen (NORCO/VICODIN) 5-325 MG tablet Take 1-2 tablets by mouth every 4 (four) hours as needed for moderate pain or severe pain. 02/28/16   Abigail Miyamoto, MD  ibuprofen (ADVIL,MOTRIN) 800 MG tablet Take 1 tablet (800 mg total) by mouth every 8 (eight) hours as needed for mild pain. 05/10/17   Ward, Layla Maw, DO  metFORMIN (GLUCOPHAGE) 1000  MG tablet Take 1,000 mg by mouth 2 (two) times daily with a meal.    [provider]  methylPREDNISolone (MEDROL DOSEPAK) 4 MG TBPK tablet Take as directed on package 05/11/17   Esteven Overfelt Lyn, MD  naproxen (NAPROSYN) 500 MG tablet Take 1 tablet (500 mg total) by mouth 2 (two) times daily. 04/22/16   Jerelyn Scott, MD  oxyCODONE-acetaminophen  (PERCOCET/ROXICET) 5-325 MG tablet Take 1-2 tablets by mouth every 6 (six) hours as needed for severe pain. 05/10/17   Ward, Layla Maw, DO  predniSONE (DELTASONE) 20 MG tablet Take 1 tablet (20 mg total) by mouth 2 (two) times daily with a meal. 05/11/16   Rolland Porter, MD  PRESCRIPTION MEDICATION Prescription cough meds.    [provider]  sitaGLIPtin-metformin (JANUMET) 50-1000 MG tablet Take 1 tablet by mouth 2 (two) times daily with a meal.    [provider]    Family History History reviewed. No pertinent family history.  Social History Social History  Substance Use Topics  . Smoking status: Current Every Day Smoker    Packs/day: 1.00    Years: 15.00    Types: Cigarettes  . Smokeless tobacco: Never Used  . Alcohol use No     Allergies   Sulfa antibiotics   Review of Systems Review of Systems  Constitutional: Negative for chills and fever.  Gastrointestinal: Positive for constipation. Negative for nausea and vomiting.       Negative for bowel incontinence.  Genitourinary: Negative for dysuria.       Negative for urinary incontinence.  Musculoskeletal: Positive for back pain.  Neurological: Positive for numbness (tingling). Negative for weakness.   Physical Exam Updated Vital Signs BP 126/78 (BP Location: Right Arm)   Pulse 84   Temp 98.4 F (36.9 C) (Oral)   Resp 18   Ht 5\' 4"  (1.626 m)   Wt 127 kg (280 lb)   LMP 02/11/2017 Comment: Negative U-preg today  SpO2 100%   BMI 48.06 kg/m   Physical Exam  Constitutional: She is oriented to person, place, and time. She appears well-developed and well-nourished. No distress.  HENT:  Head: Normocephalic and atraumatic.  Eyes: Conjunctivae and EOM are normal.  Neck: Neck supple. No tracheal deviation present.  Cardiovascular: Normal rate.   Pulmonary/Chest: Effort normal. No respiratory distress.  Musculoskeletal: Normal range of motion.  No pain in the left hip. No pain with external or internal  rotation of the left hip.  Neurological: She is alert and oriented to person, place, and time.  5/5 strength in BLE's. Good strength with flexion and extension of the left knee.  Skin: Skin is warm and dry.  Psychiatric: She has a normal mood and affect. Her behavior is normal.  Nursing note and vitals reviewed.  ED Treatments / Results  Labs (all labs ordered are listed, but only abnormal results are displayed) Labs Reviewed  URINALYSIS, ROUTINE W REFLEX MICROSCOPIC - Abnormal; Notable for the following:       Result Value   APPearance CLOUDY (*)    Specific Gravity, Urine 1.031 (*)    Glucose, UA >=500 (*)    Leukocytes, UA MODERATE (*)    All other components within normal limits  URINALYSIS, MICROSCOPIC (REFLEX) - Abnormal; Notable for the following:    Bacteria, UA MANY (*)    Squamous Epithelial / LPF 6-30 (*)    All other components within normal limits  PREGNANCY, URINE    EKG  EKG Interpretation None  Radiology Ct Lumbar Spine Wo Contrast  Result Date: 05/11/2017 CLINICAL DATA:  Lumbar pain.  Left hip pain EXAM: CT LUMBAR SPINE WITHOUT CONTRAST TECHNIQUE: Multidetector CT imaging of the lumbar spine was performed without intravenous contrast administration. Multiplanar CT image reconstructions were also generated. COMPARISON:  Lumbar radiographs 05/11/2017 FINDINGS: Segmentation: Normal Alignment: Normal Vertebrae: Negative for vertebral body fracture. Bilateral pars defects of L5. These appear chronic. Left pars defect is at a typical location, the right defect is near the right pedicle. No significant vertebral body slip. Paraspinal and other soft tissues: Negative for mass or adenopathy. Paraspinous muscles symmetric. Disc levels: L1-2:  Mild disc bulging L2-3:  Mild disc bulging without stenosis L3-4: Mild disc bulging and mild facet degeneration without significant stenosis L4-5: Mild disc bulging and moderate facet degeneration bilaterally. Mild subarticular  stenosis bilaterally. L5-S1: Bilateral pars defects of L5 as described above. No significant spinal or foraminal encroachment. 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  No spondylolisthesis. Mild lumbar degenerative change. Mild subarticular stenosis bilaterally L4-5 due to facet degeneration and hypertrophy bilaterally. Electronically Signed   By: Marlan Palauharles  Clark M.D.   On: 05/11/2017 16:43    Procedures Procedures (including critical care time)  DIAGNOSTIC STUDIES: Oxygen Saturation is 100% on RA, normal by my interpretation.    COORDINATION OF CARE: 3:45 PM Discussed treatment plan with pt at bedside and pt agreed to plan.  Medications Ordered in ED Medications  oxyCODONE-acetaminophen (PERCOCET/ROXICET) 5-325 MG per tablet 1 tablet (1 tablet Oral Given 05/11/17 1604)  ketorolac (TORADOL) 30 MG/ML injection 30 mg (30 mg Intramuscular Given 05/11/17 1653)     Initial Impression / Assessment and Plan / ED Course  I have reviewed the triage vital signs and the nursing notes.  Pertinent labs & imaging results that were available during my care of the patient were reviewed by me and considered in my medical decision making (see chart for details).    I personally performed the services described in this documentation, which was scribed in my presence. The recorded information has been reviewed and is accurate.   Patient is a 41 year old female presenting with back pain. This is patient's third visit for the same. Patient today by urgent care and given x-ray and told to come here for CT because they could not give her enough pain medication to control her pain. Patient has excellent range of motion of the leg with no pain. No weakness. No red flags. The pain was located in the left gluteal region. I think this is likely significant for sciatica. We will get a CAT scan because that is patient's expectation. We'll give her pain control here.  Pt had yeast infection diagnosed  yesterday, accounting for her urine.  She has no urinary symtpoms.   CT neg for fracutre.  Gave her dose pack (she is DM and knows it may affect sugars) and have her follow up with ortho.   Patient is comfortable, ambulatory, and taking PO at time of discharge.  Patient expressed understanding about return precautions.    Final Clinical Impressions(s) / ED Diagnoses   Final diagnoses:  Sciatica of left side    New Prescriptions Discharge Medication List as of 05/11/2017  5:51 PM    START taking these medications   Details  methylPREDNISolone (MEDROL DOSEPAK) 4 MG TBPK tablet Take as directed on package, Print          Abelino DerrickMackuen, Kaden Daughdrill Lyn, MD 05/11/17 2321

## 2017-09-01 ENCOUNTER — Other Ambulatory Visit (HOSPITAL_BASED_OUTPATIENT_CLINIC_OR_DEPARTMENT_OTHER): Payer: Self-pay | Admitting: Nurse Practitioner

## 2017-09-01 DIAGNOSIS — M25512 Pain in left shoulder: Secondary | ICD-10-CM

## 2017-09-05 ENCOUNTER — Ambulatory Visit (HOSPITAL_BASED_OUTPATIENT_CLINIC_OR_DEPARTMENT_OTHER)
Admission: RE | Admit: 2017-09-05 | Discharge: 2017-09-05 | Disposition: A | Discharge status: Home / Self Care | Payer: No Typology Code available for payment source | Source: Ambulatory Visit | Attending: Nurse Practitioner | Admitting: Nurse Practitioner

## 2017-09-05 DIAGNOSIS — M25512 Pain in left shoulder: Secondary | ICD-10-CM | POA: Diagnosis present

## 2018-03-21 ENCOUNTER — Other Ambulatory Visit: Payer: Self-pay

## 2018-03-21 DIAGNOSIS — F17210 Nicotine dependence, cigarettes, uncomplicated: Secondary | ICD-10-CM | POA: Diagnosis not present

## 2018-03-21 DIAGNOSIS — Z7984 Long term (current) use of oral hypoglycemic drugs: Secondary | ICD-10-CM | POA: Insufficient documentation

## 2018-03-21 DIAGNOSIS — R197 Diarrhea, unspecified: Secondary | ICD-10-CM | POA: Diagnosis not present

## 2018-03-21 DIAGNOSIS — R112 Nausea with vomiting, unspecified: Secondary | ICD-10-CM | POA: Insufficient documentation

## 2018-03-21 DIAGNOSIS — E119 Type 2 diabetes mellitus without complications: Secondary | ICD-10-CM | POA: Insufficient documentation

## 2018-03-21 DIAGNOSIS — Z79899 Other long term (current) drug therapy: Secondary | ICD-10-CM | POA: Insufficient documentation

## 2018-03-21 NOTE — ED Triage Notes (Signed)
C/o vomiting, fever (temp max at home 101), abd pain, "uncontrollable diarrhea", body aches, and fatigue since yesterday.

## 2018-03-22 ENCOUNTER — Emergency Department (HOSPITAL_BASED_OUTPATIENT_CLINIC_OR_DEPARTMENT_OTHER)
Admission: EM | Admit: 2018-03-22 | Discharge: 2018-03-22 | Disposition: A | Discharge status: Home / Self Care | Payer: Medicare Other | Attending: Emergency Medicine | Admitting: Emergency Medicine

## 2018-03-22 ENCOUNTER — Encounter (HOSPITAL_BASED_OUTPATIENT_CLINIC_OR_DEPARTMENT_OTHER): Payer: Self-pay | Admitting: Emergency Medicine

## 2018-03-22 DIAGNOSIS — R197 Diarrhea, unspecified: Secondary | ICD-10-CM

## 2018-03-22 DIAGNOSIS — R112 Nausea with vomiting, unspecified: Secondary | ICD-10-CM | POA: Diagnosis not present

## 2018-03-22 LAB — CBC
HEMATOCRIT: 41.7 % (ref 36.0–46.0)
Hemoglobin: 14.1 g/dL (ref 12.0–15.0)
MCH: 27.9 pg (ref 26.0–34.0)
MCHC: 33.8 g/dL (ref 30.0–36.0)
MCV: 82.4 fL (ref 78.0–100.0)
PLATELETS: 232 10*3/uL (ref 150–400)
RBC: 5.06 MIL/uL (ref 3.87–5.11)
RDW: 14.6 % (ref 11.5–15.5)
WBC: 6.6 10*3/uL (ref 4.0–10.5)

## 2018-03-22 LAB — COMPREHENSIVE METABOLIC PANEL
ALBUMIN: 3.5 g/dL (ref 3.5–5.0)
ALK PHOS: 44 U/L (ref 38–126)
ALT: 20 U/L (ref 14–54)
AST: 17 U/L (ref 15–41)
Anion gap: 9 (ref 5–15)
BILIRUBIN TOTAL: 0.7 mg/dL (ref 0.3–1.2)
BUN: 14 mg/dL (ref 6–20)
CALCIUM: 8.2 mg/dL — AB (ref 8.9–10.3)
CO2: 25 mmol/L (ref 22–32)
Chloride: 101 mmol/L (ref 101–111)
Creatinine, Ser: 0.68 mg/dL (ref 0.44–1.00)
GFR calc Af Amer: >60 mL/min (ref >60)
GLUCOSE: 196 mg/dL — AB (ref 65–99)
POTASSIUM: 3.7 mmol/L (ref 3.5–5.1)
Sodium: 135 mmol/L (ref 135–145)
TOTAL PROTEIN: 6.8 g/dL (ref 6.5–8.1)

## 2018-03-22 LAB — URINALYSIS, ROUTINE W REFLEX MICROSCOPIC
BILIRUBIN URINE: NEGATIVE
Glucose, UA: >=500 mg/dL — AB
KETONES UR: 15 mg/dL — AB
Leukocytes, UA: NEGATIVE
NITRITE: NEGATIVE
PH: 5.5 (ref 5.0–8.0)
PROTEIN: NEGATIVE mg/dL
Specific Gravity, Urine: >1.03 — ABNORMAL HIGH (ref 1.005–1.030)

## 2018-03-22 LAB — PREGNANCY, URINE: PREG TEST UR: NEGATIVE

## 2018-03-22 LAB — CBG MONITORING, ED: Glucose-Capillary: 179 mg/dL — ABNORMAL HIGH (ref 65–99)

## 2018-03-22 LAB — URINALYSIS, MICROSCOPIC (REFLEX)

## 2018-03-22 LAB — LIPASE, BLOOD: Lipase: 26 U/L (ref 11–51)

## 2018-03-22 MED ORDER — SODIUM CHLORIDE 0.9 % IV BOLUS
1000.0000 mL | Freq: Once | INTRAVENOUS | Status: AC
  Administered 2018-03-22: 1000 mL via INTRAVENOUS

## 2018-03-22 MED ORDER — ONDANSETRON HCL 4 MG/2ML IJ SOLN
4.0000 mg | Freq: Once | INTRAMUSCULAR | Status: AC
  Administered 2018-03-22: 4 mg via INTRAVENOUS
  Filled 2018-03-22: qty 2

## 2018-03-22 MED ORDER — ONDANSETRON HCL 4 MG PO TABS: 4.0000 mg | ORAL_TABLET | Freq: Four times a day (QID) | ORAL | 0 refills | Status: AC | PRN

## 2018-03-22 MED ORDER — LOPERAMIDE HCL 2 MG PO CAPS
4.0000 mg | ORAL_CAPSULE | Freq: Once | ORAL | Status: AC
  Administered 2018-03-22: 4 mg via ORAL
  Filled 2018-03-22: qty 2

## 2018-03-22 MED ORDER — ONDANSETRON 4 MG PO TBDP
4.0000 mg | ORAL_TABLET | Freq: Once | ORAL | Status: AC | PRN
  Administered 2018-03-22: 4 mg via ORAL
  Filled 2018-03-22: qty 1

## 2018-03-22 MED ORDER — KETOROLAC TROMETHAMINE 30 MG/ML IJ SOLN
30.0000 mg | Freq: Once | INTRAMUSCULAR | Status: AC
  Administered 2018-03-22: 30 mg via INTRAVENOUS
  Filled 2018-03-22: qty 1

## 2018-03-22 NOTE — ED Notes (Signed)
Pt tolerating po fluids. States she is ready to go home.

## 2018-03-22 NOTE — ED Provider Notes (Signed)
[facility] EMERGENCY DEPARTMENT Provider Note   CSN: [id number] Arrival date & time: 03/21/18  2343     History   Chief Complaint Chief Complaint  Patient presents with  . Emesis    HPI Mary Novak is a 42 y.o. female.  The history is provided by the patient.  Emesis    She has history of diabetes and attention deficit disorder and comes in with nausea, vomiting, diarrhea for the last 24 hours.  She has had fever to 101.9 with associated chills and sweats.  She complains of generalized body aches which she rates at 9/10.  There has been some mild lower abdominal pain as well.  There has been no blood or mucus in stool or emesis.  She has had a sick contact with similar illness last week.  There is been no treatment at home.  Nothing makes symptoms better, nothing makes them worse.  Past Medical History:  Diagnosis Date  . ADHD (attention deficit hyperactivity disorder)   . Cholecystitis 02/2016  . Diabetes mellitus type 2, noninsulin dependent (HCC)     There are no active problems to display for this patient.   Past Surgical History:  Procedure Laterality Date  . CESAREAN SECTION  2011  . CHOLECYSTECTOMY N/A 02/28/2016   Procedure: LAPAROSCOPIC CHOLECYSTECTOMY;  Surgeon: Abigail Miyamoto, MD;  Location: [facility];  Service: General;  Laterality: N/A;     OB History   None      Home Medications    Prior to Admission medications   Medication Sig Start Date End Date Taking? Authorizing Provider  benzonatate (TESSALON) 100 MG capsule Take 1 capsule (100 mg total) by mouth every 8 (eight) hours. 05/11/16  Yes Rolland Porter, MD  butalbital-aspirin-caffeine [facility]) [phone] MG tablet Take 1 tablet by mouth 2 (two) times daily as needed for headache.   Yes [provider]  dapagliflozin propanediol (FARXIGA) 5 MG TABS tablet Take 5 mg by mouth daily.   Yes [provider]  desvenlafaxine (PRISTIQ) 50 MG 24 hr tablet  Take 50 mg by mouth daily.   Yes [provider]  famciclovir (FAMVIR) 250 MG tablet Take 250 mg by mouth daily.   Yes [provider]  albuterol (PROVENTIL) (2.5 MG/3ML) 0.083% nebulizer solution Take 2.5 mg by nebulization every 6 (six) hours as needed for wheezing or shortness of breath.    [provider]  amphetamine-dextroamphetamine (ADDERALL) 30 MG tablet Take 30 mg by mouth 2 (two) times daily.    [provider]  buPROPion (WELLBUTRIN XL) 300 MG 24 hr tablet Take 300 mg by mouth daily.    [provider]  cyclobenzaprine (FLEXERIL) 5 MG tablet Take 1 tablet (5 mg total) by mouth 3 (three) times daily as needed for muscle spasms. 04/22/16   Mabe, Latanya Maudlin, MD  glipiZIDE (GLUCOTROL) 5 MG tablet Take 5 mg by mouth daily before breakfast.    [provider]  HYDROcodone-acetaminophen (NORCO/VICODIN) 5-325 MG tablet Take 1-2 tablets by mouth every 4 (four) hours as needed for moderate pain or severe pain. 02/28/16   Abigail Miyamoto, MD  ibuprofen (ADVIL,MOTRIN) 800 MG tablet Take 1 tablet (800 mg total) by mouth every 8 (eight) hours as needed for mild pain. 05/10/17   Ward, Layla Maw, DO  metFORMIN (GLUCOPHAGE) 1000 MG tablet Take 1,000 mg by mouth 2 (two) times daily with a meal.    [provider]  methylPREDNISolone (MEDROL DOSEPAK) 4 MG TBPK tablet Take as  directed on package 05/11/17   Mackuen, Courteney Lyn, MD  naproxen (NAPROSYN) 500 MG tablet Take 1 tablet (500 mg total) by mouth 2 (two) times daily. 04/22/16   Mabe, Latanya MaudlinMartha L, MD  oxyCODONE-acetaminophen (PERCOCET/ROXICET) 5-325 MG tablet Take 1-2 tablets by mouth every 6 (six) hours as needed for severe pain. 05/10/17   Ward, Layla MawKristen N, DO  predniSONE (DELTASONE) 20 MG tablet Take 1 tablet (20 mg total) by mouth 2 (two) times daily with a meal. 05/11/16   Rolland PorterJames, Mark, MD  PRESCRIPTION MEDICATION Prescription cough meds.    [provider]  sitaGLIPtin-metformin (JANUMET)  50-1000 MG tablet Take 1 tablet by mouth 2 (two) times daily with a meal.    [provider]    Family History No family history on file.  Social History Social History   Tobacco Use  . Smoking status: Current Every Day Smoker    Packs/day: 1.00    Years: 15.00    Pack years: 15.00    Types: Cigarettes  . Smokeless tobacco: Never Used  Substance Use Topics  . Alcohol use: No  . Drug use: No     Allergies   Sulfa antibiotics   Review of Systems Review of Systems  Gastrointestinal: Positive for vomiting.  All other systems reviewed and are negative.    Physical Exam Updated Vital Signs BP 114/69 (BP Location: Right Arm)   Pulse (!) 102   Temp 99 F (37.2 C) (Oral)   Resp 20   Ht 5\' 4"  (1.626 m)   Wt 127 kg (280 lb)   LMP 03/16/2018   SpO2 99%   BMI 48.06 kg/m   Physical Exam  Nursing note and vitals reviewed.  42 year old female, resting comfortably and in no acute distress. Vital signs are significant for borderline elevated heart rate. Oxygen saturation is 99%, which is normal. Head is normocephalic and atraumatic. PERRLA, EOMI. Oropharynx is clear. Neck is nontender and supple without adenopathy or JVD. Back is nontender and there is no CVA tenderness. Lungs are clear without rales, wheezes, or rhonchi. Chest is nontender. Heart has regular rate and rhythm without murmur. Abdomen is soft, flat, with mild suprapubic tenderness.  There is no rebound or guarding.  There are no masses or hepatosplenomegaly and peristalsis is hypoactive. Extremities have trace edema, full range of motion is present. Skin is warm and dry without rash. Neurologic: Mental status is normal, cranial nerves are intact, there are no motor or sensory deficits.  ED Treatments / Results  Labs (all labs ordered are listed, but only abnormal results are displayed) Labs Reviewed  COMPREHENSIVE METABOLIC PANEL - Abnormal; Notable for the following components:      Result  Value   Glucose, Bld 196 (*)    Calcium 8.2 (*)    All other components within normal limits  URINALYSIS, ROUTINE W REFLEX MICROSCOPIC - Abnormal; Notable for the following components:   APPearance CLOUDY (*)    Specific Gravity, Urine >1.030 (*)    Glucose, UA >=500 (*)    Hgb urine dipstick LARGE (*)    Ketones, ur 15 (*)    All other components within normal limits  URINALYSIS, MICROSCOPIC (REFLEX) - Abnormal; Notable for the following components:   Bacteria, UA MANY (*)    Squamous Epithelial / LPF 6-30 (*)    All other components within normal limits  CBG MONITORING, ED - Abnormal; Notable for the following components:   Glucose-Capillary 179 (*)    All other components  within normal limits  LIPASE, BLOOD  CBC  PREGNANCY, URINE    Procedures Procedures   Medications Ordered in ED Medications  ondansetron (ZOFRAN-ODT) disintegrating tablet 4 mg (4 mg Oral Given 03/22/18 0009)  ondansetron (ZOFRAN) injection 4 mg (4 mg Intravenous Given 03/22/18 0340)  sodium chloride 0.9 % bolus 1,000 mL (0 mLs Intravenous Stopped 03/22/18 0452)  loperamide (IMODIUM) capsule 4 mg (4 mg Oral Given 03/22/18 0340)  ketorolac (TORADOL) 30 MG/ML injection 30 mg (30 mg Intravenous Given 03/22/18 0347)     Initial Impression / Assessment and Plan / ED Course  I have reviewed the triage vital signs and the nursing notes.  Pertinent lab results that were available during my care of the patient were reviewed by me and considered in my medical decision making (see chart for details).  Nausea, vomiting, diarrhea in pattern suggestive of viral gastroenteritis.  Given recent sick contact, this diagnosis is even more likely.  She is diabetic, so do need to rule out ketoacidosis.  Urinalysis does show presence of some ketones, but metabolic panel shows normal CO2 and normal anion gap.  Nausea has improved with dose of ondansetron at triage.  She is given IV fluids, additional ondansetron, and a dose of oral  loperamide.  Old records are reviewed, and she has no relevant past visits.  She feels much better after above-noted treatment.  Of note, urinalysis does show many bacteria, but she does not clinically have a urinary tract infection, so this is not treated.  She is discharged with prescription for ondansetron, told to use over-the-counter loperamide as needed for diarrhea.  Return precautions discussed.    Final Clinical Impressions(s) / ED Diagnoses   Final diagnoses:  Nausea vomiting and diarrhea    ED Discharge Orders        Ordered    ondansetron (ZOFRAN) 4 MG tablet  Every 6 hours PRN     03/22/18 0451       Dione Booze, MD 03/22/18 [phone]

## 2018-03-22 NOTE — ED Notes (Signed)
Report received 

## 2018-03-22 NOTE — Discharge Instructions (Addendum)
Take loperamide (Imodium AD) as needed for diarrhea. Take acetaminophen or ibuprofen as needed for fever or aching. Return if symptoms are not being adequately controlled at home. °

## 2018-07-02 ENCOUNTER — Emergency Department (HOSPITAL_BASED_OUTPATIENT_CLINIC_OR_DEPARTMENT_OTHER): Payer: Medicare Other

## 2018-07-02 ENCOUNTER — Emergency Department (HOSPITAL_BASED_OUTPATIENT_CLINIC_OR_DEPARTMENT_OTHER)
Admission: EM | Admit: 2018-07-02 | Discharge: 2018-07-02 | Disposition: A | Discharge status: Home / Self Care | Payer: Medicare Other | Attending: Emergency Medicine | Admitting: Emergency Medicine

## 2018-07-02 ENCOUNTER — Encounter (HOSPITAL_BASED_OUTPATIENT_CLINIC_OR_DEPARTMENT_OTHER): Payer: Self-pay | Admitting: *Deleted

## 2018-07-02 ENCOUNTER — Other Ambulatory Visit: Payer: Self-pay

## 2018-07-02 DIAGNOSIS — R1031 Right lower quadrant pain: Secondary | ICD-10-CM

## 2018-07-02 DIAGNOSIS — Z3202 Encounter for pregnancy test, result negative: Secondary | ICD-10-CM | POA: Diagnosis not present

## 2018-07-02 DIAGNOSIS — Z7984 Long term (current) use of oral hypoglycemic drugs: Secondary | ICD-10-CM | POA: Insufficient documentation

## 2018-07-02 DIAGNOSIS — F17210 Nicotine dependence, cigarettes, uncomplicated: Secondary | ICD-10-CM | POA: Insufficient documentation

## 2018-07-02 DIAGNOSIS — E119 Type 2 diabetes mellitus without complications: Secondary | ICD-10-CM | POA: Insufficient documentation

## 2018-07-02 DIAGNOSIS — Z79899 Other long term (current) drug therapy: Secondary | ICD-10-CM | POA: Insufficient documentation

## 2018-07-02 DIAGNOSIS — N83202 Unspecified ovarian cyst, left side: Secondary | ICD-10-CM | POA: Diagnosis not present

## 2018-07-02 DIAGNOSIS — R109 Unspecified abdominal pain: Secondary | ICD-10-CM

## 2018-07-02 DIAGNOSIS — N83201 Unspecified ovarian cyst, right side: Secondary | ICD-10-CM | POA: Diagnosis not present

## 2018-07-02 DIAGNOSIS — K5900 Constipation, unspecified: Secondary | ICD-10-CM | POA: Diagnosis not present

## 2018-07-02 LAB — CBC WITH DIFFERENTIAL/PLATELET
BASOS ABS: 0 10*3/uL (ref 0.0–0.1)
BASOS PCT: 1 %
EOS ABS: 0.1 10*3/uL (ref 0.0–0.7)
Eosinophils Relative: 1 %
HCT: 42.8 % (ref 36.0–46.0)
HEMOGLOBIN: 14.2 g/dL (ref 12.0–15.0)
Lymphocytes Relative: 18 %
Lymphs Abs: 1.5 10*3/uL (ref 0.7–4.0)
MCH: 27.5 pg (ref 26.0–34.0)
MCHC: 33.2 g/dL (ref 30.0–36.0)
MCV: 82.8 fL (ref 78.0–100.0)
Monocytes Absolute: 0.5 10*3/uL (ref 0.1–1.0)
Monocytes Relative: 6 %
NEUTROS ABS: 6.4 10*3/uL (ref 1.7–7.7)
NEUTROS PCT: 74 %
Platelets: 263 10*3/uL (ref 150–400)
RBC: 5.17 MIL/uL — AB (ref 3.87–5.11)
RDW: 15 % (ref 11.5–15.5)
WBC: 8.6 10*3/uL (ref 4.0–10.5)

## 2018-07-02 LAB — URINALYSIS, ROUTINE W REFLEX MICROSCOPIC
Bilirubin Urine: NEGATIVE
Glucose, UA: >=500 mg/dL — AB
KETONES UR: NEGATIVE mg/dL
Leukocytes, UA: NEGATIVE
NITRITE: NEGATIVE
PROTEIN: NEGATIVE mg/dL
Specific Gravity, Urine: 1.025 (ref 1.005–1.030)
pH: 5 (ref 5.0–8.0)

## 2018-07-02 LAB — PREGNANCY, URINE: PREG TEST UR: NEGATIVE

## 2018-07-02 LAB — URINALYSIS, MICROSCOPIC (REFLEX)

## 2018-07-02 LAB — COMPREHENSIVE METABOLIC PANEL
ALBUMIN: 4 g/dL (ref 3.5–5.0)
ALK PHOS: 56 U/L (ref 38–126)
ALT: 23 U/L (ref 0–44)
AST: 19 U/L (ref 15–41)
Anion gap: 8 (ref 5–15)
BUN: 13 mg/dL (ref 6–20)
CALCIUM: 9.2 mg/dL (ref 8.9–10.3)
CO2: 25 mmol/L (ref 22–32)
Chloride: 106 mmol/L (ref 98–111)
Creatinine, Ser: 0.57 mg/dL (ref 0.44–1.00)
GFR calc Af Amer: >60 mL/min (ref >60)
GFR calc non Af Amer: >60 mL/min (ref >60)
GLUCOSE: 133 mg/dL — AB (ref 70–99)
Potassium: 4 mmol/L (ref 3.5–5.1)
SODIUM: 139 mmol/L (ref 135–145)
Total Bilirubin: 0.5 mg/dL (ref 0.3–1.2)
Total Protein: 7.4 g/dL (ref 6.5–8.1)

## 2018-07-02 LAB — WET PREP, GENITAL
Clue Cells Wet Prep HPF POC: NONE SEEN
SPERM: NONE SEEN
TRICH WET PREP: NONE SEEN
Yeast Wet Prep HPF POC: NONE SEEN

## 2018-07-02 LAB — LIPASE, BLOOD: Lipase: 42 U/L (ref 11–51)

## 2018-07-02 MED ORDER — MORPHINE SULFATE (PF) 4 MG/ML IV SOLN
INTRAVENOUS | Status: AC
  Filled 2018-07-02: qty 1

## 2018-07-02 MED ORDER — DICYCLOMINE HCL 20 MG PO TABS: 20.0000 mg | ORAL_TABLET | Freq: Two times a day (BID) | ORAL | 0 refills | Status: DC

## 2018-07-02 MED ORDER — MORPHINE SULFATE (PF) 4 MG/ML IV SOLN
4.0000 mg | Freq: Once | INTRAVENOUS | Status: AC
  Administered 2018-07-02: 4 mg via INTRAVENOUS
  Filled 2018-07-02: qty 1

## 2018-07-02 MED ORDER — ONDANSETRON HCL 4 MG/2ML IJ SOLN
4.0000 mg | Freq: Once | INTRAMUSCULAR | Status: AC
  Administered 2018-07-02: 4 mg via INTRAVENOUS
  Filled 2018-07-02: qty 2

## 2018-07-02 MED ORDER — MORPHINE SULFATE (PF) 4 MG/ML IV SOLN
4.0000 mg | Freq: Once | INTRAVENOUS | Status: AC
  Administered 2018-07-02: 4 mg via INTRAVENOUS

## 2018-07-02 MED ORDER — SODIUM CHLORIDE 0.9 % IV BOLUS
1000.0000 mL | Freq: Once | INTRAVENOUS | Status: AC
  Administered 2018-07-02: 1000 mL via INTRAVENOUS

## 2018-07-02 MED ORDER — DICYCLOMINE HCL 10 MG PO CAPS
20.0000 mg | ORAL_CAPSULE | Freq: Once | ORAL | Status: AC
  Administered 2018-07-02: 20 mg via ORAL
  Filled 2018-07-02: qty 2

## 2018-07-02 MED FILL — DICYCLOMINE 20 MG TABLET: 20 | 10 days supply | Qty: 20 | Fill #0

## 2018-07-02 NOTE — ED Triage Notes (Signed)
Right lower quadrant pain x 15 minutes. Sudden onset.

## 2018-07-02 NOTE — Discharge Instructions (Signed)
Your CT and ultrasound show evidence of a 6-7 cm ovarian cyst present over the right ovary and a much smaller one present over the left, no evidence of ovarian torsion.  Your scan does show a fair amount of constipation on the right side which I think could be causing some of the intermittent pains you have been having.  You may use Bentyl as needed for pain in addition to ibuprofen and Tylenol. Please begin using MiraLAX and Dulcolax daily to help relieve constipation that is causing spasms.  Make sure you are drinking plenty of fluids, you may also increase your fiber intake.  Please follow-up with your OB/GYN in 6 to 12 weeks for a repeat ultrasound and follow-up with your primary care doctor regarding constipation.  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  Return to the emergency department for worsening pain, nausea, vomiting, blood in the stool or any other new or concerning symptoms.

## 2018-07-02 NOTE — ED Notes (Signed)
ED Provider at bedside. 

## 2018-07-02 NOTE — ED Notes (Signed)
Received patient back from ultrasound.

## 2018-07-02 NOTE — ED Notes (Signed)
Patient transported to Ultrasound 

## 2018-07-02 NOTE — ED Provider Notes (Signed)
[facility] EMERGENCY DEPARTMENT Provider Note   CSN: [id number] Arrival date & time: 07/02/18  1432     History   Chief Complaint Chief Complaint  Patient presents with  . Abdominal Pain    HPI Mary Novak is a 42 y.o. female.  Mary Novak is a 42 y.o. Female with a history of diabetes, ADHD and prior cholecystectomy, who presents to the emergency department for evaluation of sudden onset right-sided abdominal pain.  Pain started approximately 15 minutes prior to arrival here in the ED, she reports is sharp and constant radiating from the mid right abdomen down towards the groin and back to the back.  She reports pain is constant and not improving.  She reports feeling nauseated but no episodes of vomiting, she had one brief episode of nonbloody diarrhea, denies any melena.  No vaginal bleeding or vaginal discharge, reports last menstrual cycle was on 6/26.  She denies any associated chest pain or shortness of breath.  No meds prior to arrival to symptoms.  No history of prior kidney stones.  No abdominal surgeries aside from cholecystectomy and C-section.     Past Medical History:  Diagnosis Date  . ADHD (attention deficit hyperactivity disorder)   . Cholecystitis 02/2016  . Diabetes mellitus type 2, noninsulin dependent (HCC)     There are no active problems to display for this patient.   Past Surgical History:  Procedure Laterality Date  . CESAREAN SECTION  2011  . CHOLECYSTECTOMY N/A 02/28/2016   Procedure: LAPAROSCOPIC CHOLECYSTECTOMY;  Surgeon: Abigail Miyamoto, MD;  Location: [facility];  Service: General;  Laterality: N/A;     OB History    Gravida  4   Para  1   Term  1   Preterm      AB  3   Living        SAB  3   TAB      Ectopic      Multiple      Live Births  1            Home Medications    Prior to Admission medications   Medication Sig Start Date End Date Taking? Authorizing Provider    buPROPion (WELLBUTRIN XL) 300 MG 24 hr tablet Take 300 mg by mouth daily.   Yes [provider]  dapagliflozin propanediol (FARXIGA) 5 MG TABS tablet Take 5 mg by mouth daily.   Yes [provider]  desvenlafaxine (PRISTIQ) 50 MG 24 hr tablet Take 50 mg by mouth daily.   Yes [provider]  glipiZIDE (GLUCOTROL) 5 MG tablet Take 5 mg by mouth daily before breakfast.   Yes [provider]  naproxen (NAPROSYN) 500 MG tablet Take 1 tablet (500 mg total) by mouth 2 (two) times daily. 04/22/16  Yes Mabe, Latanya Maudlin, MD  albuterol (PROVENTIL) (2.5 MG/3ML) 0.083% nebulizer solution Take 2.5 mg by nebulization every 6 (six) hours as needed for wheezing or shortness of breath.    [provider]  amphetamine-dextroamphetamine (ADDERALL) 30 MG tablet Take 30 mg by mouth 2 (two) times daily.    [provider]  butalbital-aspirin-caffeine Benny Lennert) 50-325-40 MG tablet Take 1 tablet by mouth 2 (two) times daily as needed for headache.    [provider]  cyclobenzaprine (FLEXERIL) 5 MG tablet Take 1 tablet (5 mg total) by mouth 3 (three) times daily as needed for muscle spasms. 04/22/16   Mabe, Latanya Maudlin, MD  dicyclomine (BENTYL)  20 MG tablet Take 1 tablet (20 mg total) by mouth 2 (two) times daily. 07/02/18   Dartha Lodge, PA-C  ibuprofen (ADVIL,MOTRIN) 800 MG tablet Take 1 tablet (800 mg total) by mouth every 8 (eight) hours as needed for mild pain. 05/10/17   Ward, Layla Maw, DO  metFORMIN (GLUCOPHAGE) 1000 MG tablet Take 1,000 mg by mouth 2 (two) times daily with a meal.    [provider]  methylPREDNISolone (MEDROL DOSEPAK) 4 MG TBPK tablet Take as directed on package 05/11/17   Mackuen, Courteney Lyn, MD  ondansetron (ZOFRAN) 4 MG tablet Take 1 tablet (4 mg total) by mouth every 6 (six) hours as needed for nausea or vomiting. 03/22/18   Dione Booze, MD  PRESCRIPTION MEDICATION Prescription cough meds.    [provider]   sitaGLIPtin-metformin (JANUMET) 50-1000 MG tablet Take 1 tablet by mouth 2 (two) times daily with a meal.    [provider]    Family History No family history on file.  Social History Social History   Tobacco Use  . Smoking status: Current Every Day Smoker    Packs/day: 1.00    Years: 15.00    Pack years: 15.00    Types: Cigarettes  . Smokeless tobacco: Never Used  Substance Use Topics  . Alcohol use: No  . Drug use: No     Allergies   Sulfa antibiotics   Review of Systems Review of Systems  Constitutional: Negative for chills and fever.  HENT: Negative.   Eyes: Negative for visual disturbance.  Respiratory: Negative for cough and shortness of breath.   Cardiovascular: Negative for chest pain.  Gastrointestinal: Positive for abdominal pain, diarrhea and nausea. Negative for blood in stool, constipation and vomiting.  Genitourinary: Positive for flank pain. Negative for dysuria, frequency, pelvic pain, vaginal bleeding and vaginal discharge.  Musculoskeletal: Negative for arthralgias and joint swelling.  Skin: Negative for color change and rash.  Neurological: Negative for dizziness, syncope and light-headedness.     Physical Exam Updated Vital Signs BP 127/78   Pulse 88   Temp 97.9 F (36.6 C) (Oral)   Resp 18   Ht 5\' 4"  (1.626 m)   Wt 128.1 kg (282 lb 6 oz)   LMP 06/18/2018   SpO2 100%   BMI 48.47 kg/m   Physical Exam  Constitutional: She appears well-developed and well-nourished. No distress.  HENT:  Head: Normocephalic and atraumatic.  Mouth/Throat: Oropharynx is clear and moist.  Eyes: Right eye exhibits no discharge. Left eye exhibits no discharge.  Cardiovascular: Normal rate, regular rhythm, normal heart sounds and intact distal pulses.  Pulmonary/Chest: Effort normal and breath sounds normal. No respiratory distress.  Respirations equal and unlabored, patient able to speak in full sentences, lungs clear to auscultation bilaterally   Abdominal: Normal appearance and bowel sounds are normal. She exhibits no distension. There is tenderness. There is guarding and CVA tenderness. There is no rigidity and no rebound.  Abdomen soft, nondistended, bowel sounds present throughout, abdomen tender over the right side, with some guarding in the right lower quadrant and pelvic region, there is also mild right CVA tenderness.  No rebound tenderness.  Genitourinary:  Genitourinary Comments: Chaperone present during pelvic exam.  No external genital lesions noted.  Speculum exam reveals very small amount of white discharge, cervix appears normal and os is closed.  Bimanual exam reveals no focal uterine tenderness, there is tenderness over the right adnexa with palpable mass.  Neurological: She is alert. Coordination normal.  Skin: Skin is warm and dry. She is not diaphoretic.  Psychiatric: She has a normal mood and affect. Her behavior is normal.  Nursing note and vitals reviewed.    ED Treatments / Results  Labs (all labs ordered are listed, but only abnormal results are displayed) Labs Reviewed  WET PREP, GENITAL - Abnormal; Notable for the following components:      Result Value   WBC, Wet Prep HPF POC MANY (*)    All other components within normal limits  URINALYSIS, ROUTINE W REFLEX MICROSCOPIC - Abnormal; Notable for the following components:   Glucose, UA >=500 (*)    Hgb urine dipstick SMALL (*)    All other components within normal limits  CBC WITH DIFFERENTIAL/PLATELET - Abnormal; Notable for the following components:   RBC 5.17 (*)    All other components within normal limits  COMPREHENSIVE METABOLIC PANEL - Abnormal; Notable for the following components:   Glucose, Bld 133 (*)    All other components within normal limits  URINALYSIS, MICROSCOPIC (REFLEX) - Abnormal; Notable for the following components:   Bacteria, UA MANY (*)    All other components within normal limits  PREGNANCY, URINE  LIPASE, BLOOD     EKG None  Radiology US Transvaginal Non-ob  Result Date: 07/02/2018 CLINICAL DATA:  Follow-up examination for ovarian cyst seen on prior CT. EXAM: TRANSABDOMINAL AND TRANSVAGINAL ULTRASOUND OF PELVIS DOPPLER ULTRASOUND OF OVARIES TECHNIQUE: Both transabdominal and transvaginal ultrasound examinations of the pelvis were performed. Transabdominal technique was performed for global imaging of the pelvis including uterus, ovaries, adnexal regions, and pelvic cul-de-sac. It was necessary to proceed with endovaginal exam following the transabdominal exam to visualize the uterus, endometrium, and ovaries. Color and duplex Doppler ultrasound was utilized to evaluate blood flow to the ovaries. COMPARISON:  Prior CT from earlier the same day. FINDINGS: Uterus Measurements: 8.9 x 4.3 x 5.6 cm. 8 x 7 x 8 mm intramural fibroid present at the uterine fundus. Additional 7 x 6 x 6 mm intramural fibroid present within the adjacent uterine fundus. Endometrium Thickness: 6.6 mm.  No focal abnormality visualized. Right ovary Measurements: 7.5 x 5.1 x 5.7 cm. 5.9 x 4.9 x 5.3 cm predominantly simple anechoic cyst seen arising from the right ovary. Single thin internal curvilinear septation with trace vascularity. No internal solid nodularity. Left ovary Measurements: 5.1 x 3.0 x 3.5 cm. 3.3 x 2.2 x 2.9 cm simple cyst, most consistent with a normal physiologic cyst. Pulsed Doppler evaluation of both ovaries demonstrates normal low-resistance arterial and venous waveforms. Other findings Trace free physiologic fluid within the pelvis. IMPRESSION: 1. 5.9 cm septated right ovarian cyst. While this lesion is almost certainly benign, a short interval follow-up study in 6-12 weeks to ensure resolution is suggested. 2. 3.3 cm simple left ovarian cyst, most consistent with a normal physiologic cyst. 3. No evidence for ovarian torsion. 4. Few subcentimeter uterine fibroids as above. Electronically Signed   By: Rise Mu M.D.    On: 07/02/2018 16:32   US Pelvis Complete  Result Date: 07/02/2018 CLINICAL DATA:  Follow-up examination for ovarian cyst seen on prior CT. EXAM: TRANSABDOMINAL AND TRANSVAGINAL ULTRASOUND OF PELVIS DOPPLER ULTRASOUND OF OVARIES TECHNIQUE: Both transabdominal and transvaginal ultrasound examinations of the pelvis were performed. Transabdominal technique was performed for global imaging of the pelvis including uterus, ovaries, adnexal regions, and pelvic cul-de-sac. It was necessary to proceed with endovaginal exam following the transabdominal exam to visualize the uterus, endometrium, and ovaries. Color and duplex Doppler  ultrasound was utilized to evaluate blood flow to the ovaries. COMPARISON:  Prior CT from earlier the same day. FINDINGS: Uterus Measurements: 8.9 x 4.3 x 5.6 cm. 8 x 7 x 8 mm intramural fibroid present at the uterine fundus. Additional 7 x 6 x 6 mm intramural fibroid present within the adjacent uterine fundus. Endometrium Thickness: 6.6 mm.  No focal abnormality visualized. Right ovary Measurements: 7.5 x 5.1 x 5.7 cm. 5.9 x 4.9 x 5.3 cm predominantly simple anechoic cyst seen arising from the right ovary. Single thin internal curvilinear septation with trace vascularity. No internal solid nodularity. Left ovary Measurements: 5.1 x 3.0 x 3.5 cm. 3.3 x 2.2 x 2.9 cm simple cyst, most consistent with a normal physiologic cyst. Pulsed Doppler evaluation of both ovaries demonstrates normal low-resistance arterial and venous waveforms. Other findings Trace free physiologic fluid within the pelvis. IMPRESSION: 1. 5.9 cm septated right ovarian cyst. While this lesion is almost certainly benign, a short interval follow-up study in 6-12 weeks to ensure resolution is suggested. 2. 3.3 cm simple left ovarian cyst, most consistent with a normal physiologic cyst. 3. No evidence for ovarian torsion. 4. Few subcentimeter uterine fibroids as above. Electronically Signed   By: Rise Mu M.D.   On:  07/02/2018 16:32   [place] Art/ven Flow Abd Pelv Doppler  Result Date: 07/02/2018 CLINICAL DATA:  Follow-up examination for ovarian cyst seen on prior CT. EXAM: TRANSABDOMINAL AND TRANSVAGINAL ULTRASOUND OF PELVIS DOPPLER ULTRASOUND OF OVARIES TECHNIQUE: Both transabdominal and transvaginal ultrasound examinations of the pelvis were performed. Transabdominal technique was performed for global imaging of the pelvis including uterus, ovaries, adnexal regions, and pelvic cul-de-sac. It was necessary to proceed with endovaginal exam following the transabdominal exam to visualize the uterus, endometrium, and ovaries. Color and duplex Doppler ultrasound was utilized to evaluate blood flow to the ovaries. COMPARISON:  Prior CT from earlier the same day. FINDINGS: Uterus Measurements: 8.9 x 4.3 x 5.6 cm. 8 x 7 x 8 mm intramural fibroid present at the uterine fundus. Additional 7 x 6 x 6 mm intramural fibroid present within the adjacent uterine fundus. Endometrium Thickness: 6.6 mm.  No focal abnormality visualized. Right ovary Measurements: 7.5 x 5.1 x 5.7 cm. 5.9 x 4.9 x 5.3 cm predominantly simple anechoic cyst seen arising from the right ovary. Single thin internal curvilinear septation with trace vascularity. No internal solid nodularity. Left ovary Measurements: 5.1 x 3.0 x 3.5 cm. 3.3 x 2.2 x 2.9 cm simple cyst, most consistent with a normal physiologic cyst. Pulsed Doppler evaluation of both ovaries demonstrates normal low-resistance arterial and venous waveforms. Other findings Trace free physiologic fluid within the pelvis. IMPRESSION: 1. 5.9 cm septated right ovarian cyst. While this lesion is almost certainly benign, a short interval follow-up study in 6-12 weeks to ensure resolution is suggested. 2. 3.3 cm simple left ovarian cyst, most consistent with a normal physiologic cyst. 3. No evidence for ovarian torsion. 4. Few subcentimeter uterine fibroids as above. Electronically Signed   By: Rise Mu  M.D.   On: 07/02/2018 16:32   Ct Renal Stone Study  Result Date: 07/02/2018 CLINICAL DATA:  Acute right lower quadrant abdominal pain. EXAM: CT ABDOMEN AND PELVIS WITHOUT CONTRAST TECHNIQUE: Multidetector CT imaging of the abdomen and pelvis was performed following the standard protocol without IV contrast. COMPARISON:  CT scan of December 10, 2006. FINDINGS: Lower chest: No acute abnormality. Hepatobiliary: Status post cholecystectomy. Fatty infiltration of the liver is noted. No biliary dilatation is noted. Pancreas: Unremarkable.  No pancreatic ductal dilatation or surrounding inflammatory changes. Spleen: Normal in size without focal abnormality. Adrenals/Urinary Tract: 2.2 cm left adrenal adenoma is noted. Right adrenal gland appears normal. No hydronephrosis or renal obstruction is noted. No renal or ureteral calculi are noted. Urinary bladder is unremarkable. Stomach/Bowel: Stomach is within normal limits. Appendix appears normal. No evidence of bowel wall thickening, distention, or inflammatory changes. Vascular/Lymphatic: No significant vascular findings are present. No enlarged abdominal or pelvic lymph nodes. Reproductive: 7.1 cm right ovarian cystic abnormality is noted. Uterus and left ovary are unremarkable. Other: No abdominal wall hernia or abnormality. No abdominopelvic ascites. Musculoskeletal: No acute or significant osseous findings. IMPRESSION: 7.1 cm right ovarian cystic abnormality is noted. Pelvic ultrasound is recommended to evaluate for possible cystic ovarian neoplasm. Fatty infiltration of the liver. Electronically Signed   By: Lupita Raider, M.D.   On: 07/02/2018 15:27    Procedures Procedures (including critical care time)  Medications Ordered in ED Medications  ondansetron (ZOFRAN) injection 4 mg (4 mg Intravenous Given 07/02/18 1516)  sodium chloride 0.9 % bolus 1,000 mL (0 mLs Intravenous Stopped 07/02/18 1620)  morphine 4 MG/ML injection 4 mg (4 mg Intravenous Given  07/02/18 1516)  morphine 4 MG/ML injection 4 mg ( Intravenous Not Given 07/02/18 1709)  dicyclomine (BENTYL) capsule 20 mg (20 mg Oral Given 07/02/18 1706)     Initial Impression / Assessment and Plan / ED Course  I have reviewed the triage vital signs and the nursing notes.  Pertinent labs & imaging results that were available during my care of the patient were reviewed by me and considered in my medical decision making (see chart for details).  Patient presents for evaluation of sudden onset right-sided nominal pain that occurred approximately 15 minutes prior to arrival,-sharp and constant in nature.  Associated nausea but no vomiting.  She reports one episode of diarrhea today prior to symptoms starting.  No fevers or chills.  Patient denies urinary symptoms, vaginal bleeding or discharge.  On exam vitals normal comfortable but in no acute distress.  Abdomen with some tenderness across the right side of the abdomen with mild guarding in the right lower quadrant and pelvic region.  Story most concerning for kidney stone given acute onset of pain.  Lower suspicion for appendicitis or pelvic etiology, but I feel this is less likely given the sudden onset of severe symptoms.  Will get abdominal labs and CT renal stone study.  IV fluids, morphine and Zofran given for symptomatic management.  Labs show normal leukocytosis and hemoglobin, no acute electrolyte derangements, glucose of 133, which is typical for the patient she is a type II diabetic.  Normal renal and liver function, normal lipase.  Urinalysis without signs of infection.  Patient declined STD testing, there are WBCs present on the wet prep but is otherwise unremarkable, exam not concerning for PID.   CT renal stone study shows no evidence of nephrolithiasis or obstructive uropathy and no hydronephrosis, but does show a 7 cm right ovarian cysts, recommend follow-up with pelvic ultrasound for better characterization given size.  Pelvic  ultrasound shows no evidence of ovarian torsion, there is a 5.9 cm right ovarian cyst and a 3 cm cyst on the left ovary.  No evidence of fibroids or other acute findings.  Discussed these reassuring results with the patient.  She continues to have some intermittent pains which are higher up on the abdomen that would be expected for an ovarian cyst.  On further review of  patient's CT scan she does have a stool present in the ascending colon and I think patient may be having spasms related to lesions.  Will treat with Bentyl.  Counseled patient on using daily MiraLAX and Dulcolax to relieve the stool burden, and prescription provided for Bentyl.  Patient to follow-up with her primary care doctor.  She will also follow-up with her gynecologist for a repeat ultrasound of ovarian cyst in 6 to 12 weeks.  Return precautions discussed.  Patient expresses understanding and is in agreement with this plan.  Final Clinical Impressions(s) / ED Diagnoses   Final diagnoses:  Right lower quadrant abdominal pain  Cysts of both ovaries  Abdominal spasms  Constipation, unspecified constipation type    ED Discharge Orders        Ordered    dicyclomine (BENTYL) 20 MG tablet  2 times daily     07/02/18 1721       Dartha LodgeFord, Jumar Greenstreet N, [place]PA-C 07/02/18 1921    Loren RacerYelverton, David, MD 07/02/18 [phone]

## 2018-07-12 ENCOUNTER — Other Ambulatory Visit: Payer: Self-pay

## 2018-07-12 ENCOUNTER — Encounter (HOSPITAL_BASED_OUTPATIENT_CLINIC_OR_DEPARTMENT_OTHER): Payer: Self-pay

## 2018-07-12 ENCOUNTER — Emergency Department (HOSPITAL_BASED_OUTPATIENT_CLINIC_OR_DEPARTMENT_OTHER): Payer: Medicare Other

## 2018-07-12 ENCOUNTER — Emergency Department (HOSPITAL_BASED_OUTPATIENT_CLINIC_OR_DEPARTMENT_OTHER)
Admission: EM | Admit: 2018-07-12 | Discharge: 2018-07-13 | Disposition: A | Discharge status: Home / Self Care | Payer: Medicare Other | Attending: Emergency Medicine | Admitting: Emergency Medicine

## 2018-07-12 DIAGNOSIS — Z79899 Other long term (current) drug therapy: Secondary | ICD-10-CM | POA: Insufficient documentation

## 2018-07-12 DIAGNOSIS — R1011 Right upper quadrant pain: Secondary | ICD-10-CM | POA: Diagnosis present

## 2018-07-12 DIAGNOSIS — N83201 Unspecified ovarian cyst, right side: Secondary | ICD-10-CM | POA: Diagnosis not present

## 2018-07-12 DIAGNOSIS — Z7984 Long term (current) use of oral hypoglycemic drugs: Secondary | ICD-10-CM | POA: Insufficient documentation

## 2018-07-12 DIAGNOSIS — E119 Type 2 diabetes mellitus without complications: Secondary | ICD-10-CM | POA: Diagnosis not present

## 2018-07-12 DIAGNOSIS — F17210 Nicotine dependence, cigarettes, uncomplicated: Secondary | ICD-10-CM | POA: Diagnosis not present

## 2018-07-12 HISTORY — DX: Unspecified ovarian cyst, unspecified side: N83.209

## 2018-07-12 LAB — CBC WITH DIFFERENTIAL/PLATELET
BASOS ABS: 0 10*3/uL (ref 0.0–0.1)
BASOS PCT: 0 %
EOS ABS: 0.1 10*3/uL (ref 0.0–0.7)
Eosinophils Relative: 1 %
HEMATOCRIT: 42 % (ref 36.0–46.0)
HEMOGLOBIN: 14.1 g/dL (ref 12.0–15.0)
Lymphocytes Relative: 26 %
Lymphs Abs: 2 10*3/uL (ref 0.7–4.0)
MCH: 27.5 pg (ref 26.0–34.0)
MCHC: 33.6 g/dL (ref 30.0–36.0)
MCV: 82 fL (ref 78.0–100.0)
Monocytes Absolute: 0.6 10*3/uL (ref 0.1–1.0)
Monocytes Relative: 8 %
NEUTROS ABS: 4.9 10*3/uL (ref 1.7–7.7)
NEUTROS PCT: 65 %
Platelets: 246 10*3/uL (ref 150–400)
RBC: 5.12 MIL/uL — ABNORMAL HIGH (ref 3.87–5.11)
RDW: 15 % (ref 11.5–15.5)
WBC: 7.6 10*3/uL (ref 4.0–10.5)

## 2018-07-12 LAB — URINALYSIS, ROUTINE W REFLEX MICROSCOPIC
BILIRUBIN URINE: NEGATIVE
Hgb urine dipstick: NEGATIVE
Ketones, ur: NEGATIVE mg/dL
Leukocytes, UA: NEGATIVE
NITRITE: NEGATIVE
Protein, ur: NEGATIVE mg/dL
SPECIFIC GRAVITY, URINE: 1.02 (ref 1.005–1.030)
pH: 5.5 (ref 5.0–8.0)

## 2018-07-12 LAB — COMPREHENSIVE METABOLIC PANEL
ALBUMIN: 3.7 g/dL (ref 3.5–5.0)
ALK PHOS: 49 U/L (ref 38–126)
ALT: 23 U/L (ref 0–44)
ANION GAP: 11 (ref 5–15)
AST: 17 U/L (ref 15–41)
BILIRUBIN TOTAL: 0.5 mg/dL (ref 0.3–1.2)
BUN: 16 mg/dL (ref 6–20)
CALCIUM: 9.1 mg/dL (ref 8.9–10.3)
CO2: 24 mmol/L (ref 22–32)
Chloride: 102 mmol/L (ref 98–111)
Creatinine, Ser: 0.53 mg/dL (ref 0.44–1.00)
GFR calc Af Amer: >60 mL/min (ref >60)
GFR calc non Af Amer: >60 mL/min (ref >60)
GLUCOSE: 133 mg/dL — AB (ref 70–99)
Potassium: 3.9 mmol/L (ref 3.5–5.1)
SODIUM: 137 mmol/L (ref 135–145)
Total Protein: 7.3 g/dL (ref 6.5–8.1)

## 2018-07-12 LAB — URINALYSIS, MICROSCOPIC (REFLEX): WBC UA: NONE SEEN WBC/hpf (ref 0–5)

## 2018-07-12 LAB — LIPASE, BLOOD: Lipase: 40 U/L (ref 11–51)

## 2018-07-12 LAB — PREGNANCY, URINE: Preg Test, Ur: NEGATIVE

## 2018-07-12 MED ORDER — DICYCLOMINE HCL 10 MG PO CAPS
10.0000 mg | ORAL_CAPSULE | Freq: Once | ORAL | Status: AC
  Administered 2018-07-12: 10 mg via ORAL
  Filled 2018-07-12: qty 1

## 2018-07-12 MED ORDER — ONDANSETRON HCL 4 MG/2ML IJ SOLN
4.0000 mg | Freq: Once | INTRAMUSCULAR | Status: AC
  Administered 2018-07-12: 4 mg via INTRAVENOUS
  Filled 2018-07-12: qty 2

## 2018-07-12 MED ORDER — SODIUM CHLORIDE 0.9 % IV BOLUS
1000.0000 mL | Freq: Once | INTRAVENOUS | Status: AC
  Administered 2018-07-12: 1000 mL via INTRAVENOUS

## 2018-07-12 MED ORDER — MORPHINE SULFATE (PF) 4 MG/ML IV SOLN
4.0000 mg | Freq: Once | INTRAVENOUS | Status: AC
  Administered 2018-07-12: 4 mg via INTRAVENOUS
  Filled 2018-07-12: qty 1

## 2018-07-12 NOTE — ED Notes (Signed)
Pt states she didn't got any relief from morphine and she still feels numb every where, EDP notified.

## 2018-07-12 NOTE — ED Triage Notes (Signed)
C/o right side abd pain x 20 min-states she was seen for same last week and felt better-steady gait/crying

## 2018-07-12 NOTE — ED Provider Notes (Signed)
[facility] EMERGENCY DEPARTMENT Provider Note   CSN: [id number] Arrival date & time: 07/12/18  2145     History   Chief Complaint Chief Complaint  Patient presents with  . Abdominal Pain    HPI Mary Novak is a 42 y.o. female.  HPI Mary Novak is a 42 y.o. female with history of diabetes, ovarian cyst, presents to emergency department complaining of abdominal pain.  Pain started suddenly while she was walking around [place].  States pain is in the right upper quadrant, radiates into the right flank.  Pain is described as sharp.  States she was here for similar symptoms 10 days ago, at that time had a CT scan as well as ultrasound which showed a right sided ovarian cyst, otherwise no findings.  She states that since then she did have a fall, but did not injure her abdomen.  She denies any urinary symptoms.  She does feel lightheaded and nauseated and that started this morning.  She denies any vaginal complaints.  No fever or chills.  Did not take any prior to coming in.  Past Medical History:  Diagnosis Date  . ADHD (attention deficit hyperactivity disorder)   . Cholecystitis 02/2016  . Diabetes mellitus type 2, noninsulin dependent (HCC)   . Ovarian cyst     There are no active problems to display for this patient.   Past Surgical History:  Procedure Laterality Date  . CESAREAN SECTION  2011  . CHOLECYSTECTOMY N/A 02/28/2016   Procedure: LAPAROSCOPIC CHOLECYSTECTOMY;  Surgeon: Abigail Miyamoto, MD;  Location: [facility];  Service: General;  Laterality: N/A;     OB History    Gravida  4   Para  1   Term  1   Preterm      AB  3   Living        SAB  3   TAB      Ectopic      Multiple      Live Births  1            Home Medications    Prior to Admission medications   Medication Sig Start Date End Date Taking? Authorizing Provider  albuterol (PROVENTIL) (2.5 MG/3ML) 0.083% nebulizer solution Take 2.5 mg by  nebulization every 6 (six) hours as needed for wheezing or shortness of breath.    [provider]  amphetamine-dextroamphetamine (ADDERALL) 30 MG tablet Take 30 mg by mouth 2 (two) times daily.    [provider]  buPROPion (WELLBUTRIN XL) 300 MG 24 hr tablet Take 300 mg by mouth daily.    [provider]  butalbital-aspirin-caffeine Benny Lennert) 50-325-40 MG tablet Take 1 tablet by mouth 2 (two) times daily as needed for headache.    [provider]  cyclobenzaprine (FLEXERIL) 5 MG tablet Take 1 tablet (5 mg total) by mouth 3 (three) times daily as needed for muscle spasms. 04/22/16   Mabe, Latanya Maudlin, MD  dapagliflozin propanediol (FARXIGA) 5 MG TABS tablet Take 5 mg by mouth daily.    [provider]  desvenlafaxine (PRISTIQ) 50 MG 24 hr tablet Take 50 mg by mouth daily.    [provider]  dicyclomine (BENTYL) 20 MG tablet Take 1 tablet (20 mg total) by mouth 2 (two) times daily. 07/02/18   Dartha Lodge, PA-C  glipiZIDE (GLUCOTROL) 5 MG tablet Take 5 mg by mouth daily before breakfast.    [provider]  ibuprofen (ADVIL,MOTRIN) 800 MG tablet  Take 1 tablet (800 mg total) by mouth every 8 (eight) hours as needed for mild pain. 05/10/17   Ward, Layla Maw, DO  metFORMIN (GLUCOPHAGE) 1000 MG tablet Take 1,000 mg by mouth 2 (two) times daily with a meal.    [provider]  methylPREDNISolone (MEDROL DOSEPAK) 4 MG TBPK tablet Take as directed on package 05/11/17   Mackuen, Courteney Lyn, MD  naproxen (NAPROSYN) 500 MG tablet Take 1 tablet (500 mg total) by mouth 2 (two) times daily. 04/22/16   Mabe, Latanya Maudlin, MD  ondansetron (ZOFRAN) 4 MG tablet Take 1 tablet (4 mg total) by mouth every 6 (six) hours as needed for nausea or vomiting. 03/22/18   Dione Booze, MD  PRESCRIPTION MEDICATION Prescription cough meds.    [provider]  sitaGLIPtin-metformin (JANUMET) 50-1000 MG tablet Take 1 tablet by mouth 2 (two) times daily with a  meal.    [provider]    Family History No family history on file.  Social History Social History   Tobacco Use  . Smoking status: Current Every Day Smoker    Packs/day: 1.00    Years: 15.00    Pack years: 15.00    Types: Cigarettes  . Smokeless tobacco: Never Used  Substance Use Topics  . Alcohol use: No  . Drug use: No     Allergies   Sulfa antibiotics   Review of Systems Review of Systems  Constitutional: Negative for chills and fever.  Respiratory: Negative for cough, chest tightness and shortness of breath.   Cardiovascular: Negative for chest pain, palpitations and leg swelling.  Gastrointestinal: Positive for abdominal pain and nausea. Negative for diarrhea and vomiting.  Genitourinary: Positive for flank pain. Negative for dysuria, pelvic pain, vaginal bleeding, vaginal discharge and vaginal pain.  Musculoskeletal: Negative for arthralgias, myalgias, neck pain and neck stiffness.  Skin: Negative for rash.  Neurological: Negative for dizziness, weakness and headaches.  All other systems reviewed and are negative.    Physical Exam Updated Vital Signs BP (!) 145/97 (BP Location: Left Arm)   Pulse 91   Temp 97.9 F (36.6 C) (Oral)   Resp (!) 24   Ht 5\' 4"  (1.626 m)   Wt 130.2 kg (287 lb 1.6 oz)   LMP 06/17/2018   SpO2 100%   BMI 49.28 kg/m   Physical Exam  Constitutional: She appears well-developed and well-nourished. No distress.  HENT:  Head: Normocephalic.  Eyes: Conjunctivae are normal.  Neck: Neck supple.  Cardiovascular: Normal rate, regular rhythm and normal heart sounds.  Pulmonary/Chest: Effort normal and breath sounds normal. No respiratory distress. She has no wheezes. She has no rales.  Abdominal: Soft. Bowel sounds are normal. She exhibits no distension. There is tenderness in the right upper quadrant. There is no rebound.  Musculoskeletal: She exhibits no edema.  Neurological: She is alert.  Skin: Skin is warm and dry.    Psychiatric: She has a normal mood and affect. Her behavior is normal.  Nursing note and vitals reviewed.    ED Treatments / Results  Labs (all labs ordered are listed, but only abnormal results are displayed) Labs Reviewed  CBC WITH DIFFERENTIAL/PLATELET - Abnormal; Notable for the following components:      Result Value   RBC 5.12 (*)    All other components within normal limits  COMPREHENSIVE METABOLIC PANEL - Abnormal; Notable for the following components:   Glucose, Bld 133 (*)    All other components within normal limits  URINALYSIS, ROUTINE W  REFLEX MICROSCOPIC - Abnormal; Notable for the following components:   APPearance CLOUDY (*)    Glucose, UA >=500 (*)    All other components within normal limits  URINALYSIS, MICROSCOPIC (REFLEX) - Abnormal; Notable for the following components:   Bacteria, UA FEW (*)    All other components within normal limits  LIPASE, BLOOD  PREGNANCY, URINE    EKG None  Radiology No results found.  Procedures Procedures (including critical care time)  Medications Ordered in ED Medications  morphine 4 MG/ML injection 4 mg (has no administration in time range)  ondansetron (ZOFRAN) injection 4 mg (has no administration in time range)  sodium chloride 0.9 % bolus 1,000 mL (has no administration in time range)     Initial Impression / Assessment and Plan / ED Course  I have reviewed the triage vital signs and the nursing notes.  Pertinent labs & imaging results that were available during my care of the patient were reviewed by me and considered in my medical decision making (see chart for details).     Patient with sudden onset of right upper quadrant pain.  History of cholecystectomy.  History of similar pain 10 days ago, diagnosed with a right ovarian cyst.  On my exam, patient has no right lower quadrant tenderness.  Most of her pain is in the right flank and right upper quadrant.  Will start with labs, give pain medications,  patient is tearful and crying in pain.  11:23 PM Pain improved.  Patient's labs are unremarkable, normal white blood cell count, normal electrolytes and LFTs.  Normal lipase.  Urine with no signs of infection.  No hematuria.  Patient continues to have severe pain in her right side.  She is very concerned, and states she is very worried about going home without any answers.  We discussed the fact that sometimes we cannot find answers in the emergency department as well as rule out life-threatening processes.  Given the amount of pain still present, will get CT abdomen pelvis with contrast for further evaluation.  If negative, patient will follow with her doctor this week for further work-up.  Vitals:   07/12/18 2150  BP: (!) 145/97  Pulse: 91  Resp: (!) 24  Temp: 97.9 F (36.6 C)  TempSrc: Oral  SpO2: 100%  Weight: 130.2 kg (287 lb 1.6 oz)  Height: 5\' 4"  (1.626 m)     Final Clinical Impressions(s) / ED Diagnoses   Final diagnoses:  Right upper quadrant abdominal pain    ED Discharge Orders    None       Jaynie CrumbleKirichenko, Amira Podolak, PA-C 07/12/18 2324    Maia PlanLong, Joshua G, MD 07/13/18 [phone]

## 2018-07-12 NOTE — Discharge Instructions (Addendum)
Your CT scan did not show any findings to explain your pain other than the known right ovarian cyst. Your labs are normal. Take tylenol or motrin for pain. Drink plenty of fluids. Follow up with your doctor for further evaluation if pain continues.

## 2018-07-13 DIAGNOSIS — R1011 Right upper quadrant pain: Secondary | ICD-10-CM | POA: Diagnosis not present

## 2018-07-13 MED ORDER — FENTANYL CITRATE (PF) 100 MCG/2ML IJ SOLN
100.0000 ug | Freq: Once | INTRAMUSCULAR | Status: AC
  Administered 2018-07-13: 100 ug via INTRAVENOUS
  Filled 2018-07-13: qty 2

## 2018-07-13 MED ORDER — IOPAMIDOL (ISOVUE-300) INJECTION 61%
100.0000 mL | Freq: Once | INTRAVENOUS | Status: AC | PRN
  Administered 2018-07-13: 100 mL via INTRAVENOUS

## 2019-01-10 ENCOUNTER — Encounter (HOSPITAL_BASED_OUTPATIENT_CLINIC_OR_DEPARTMENT_OTHER): Payer: Self-pay

## 2019-01-10 ENCOUNTER — Emergency Department (HOSPITAL_BASED_OUTPATIENT_CLINIC_OR_DEPARTMENT_OTHER): Payer: Medicare Other

## 2019-01-10 ENCOUNTER — Other Ambulatory Visit: Payer: Self-pay

## 2019-01-10 ENCOUNTER — Emergency Department (HOSPITAL_BASED_OUTPATIENT_CLINIC_OR_DEPARTMENT_OTHER)
Admission: EM | Admit: 2019-01-10 | Discharge: 2019-01-10 | Disposition: A | Discharge status: Home / Self Care | Payer: Medicare Other | Attending: Emergency Medicine | Admitting: Emergency Medicine

## 2019-01-10 DIAGNOSIS — Z79899 Other long term (current) drug therapy: Secondary | ICD-10-CM | POA: Insufficient documentation

## 2019-01-10 DIAGNOSIS — E119 Type 2 diabetes mellitus without complications: Secondary | ICD-10-CM | POA: Diagnosis not present

## 2019-01-10 DIAGNOSIS — Z7984 Long term (current) use of oral hypoglycemic drugs: Secondary | ICD-10-CM | POA: Insufficient documentation

## 2019-01-10 DIAGNOSIS — R112 Nausea with vomiting, unspecified: Secondary | ICD-10-CM

## 2019-01-10 DIAGNOSIS — F17210 Nicotine dependence, cigarettes, uncomplicated: Secondary | ICD-10-CM | POA: Insufficient documentation

## 2019-01-10 DIAGNOSIS — R197 Diarrhea, unspecified: Secondary | ICD-10-CM | POA: Insufficient documentation

## 2019-01-10 DIAGNOSIS — F909 Attention-deficit hyperactivity disorder, unspecified type: Secondary | ICD-10-CM | POA: Insufficient documentation

## 2019-01-10 DIAGNOSIS — R1010 Upper abdominal pain, unspecified: Secondary | ICD-10-CM | POA: Diagnosis not present

## 2019-01-10 DIAGNOSIS — R110 Nausea: Secondary | ICD-10-CM | POA: Diagnosis present

## 2019-01-10 LAB — COMPREHENSIVE METABOLIC PANEL
ALT: 31 U/L (ref 0–44)
AST: 18 U/L (ref 15–41)
Albumin: 4.2 g/dL (ref 3.5–5.0)
Alkaline Phosphatase: 56 U/L (ref 38–126)
Anion gap: 10 (ref 5–15)
BUN: 16 mg/dL (ref 6–20)
CO2: 21 mmol/L — ABNORMAL LOW (ref 22–32)
Calcium: 9.3 mg/dL (ref 8.9–10.3)
Chloride: 100 mmol/L (ref 98–111)
Creatinine, Ser: 0.54 mg/dL (ref 0.44–1.00)
GFR calc Af Amer: >60 mL/min (ref >60)
GFR calc non Af Amer: >60 mL/min (ref >60)
Glucose, Bld: 299 mg/dL — ABNORMAL HIGH (ref 70–99)
Potassium: 4.1 mmol/L (ref 3.5–5.1)
Sodium: 131 mmol/L — ABNORMAL LOW (ref 135–145)
Total Bilirubin: 0.9 mg/dL (ref 0.3–1.2)
Total Protein: 7.7 g/dL (ref 6.5–8.1)

## 2019-01-10 LAB — CBC WITH DIFFERENTIAL/PLATELET
Abs Immature Granulocytes: 0.04 10*3/uL (ref 0.00–0.07)
Basophils Absolute: 0 10*3/uL (ref 0.0–0.1)
Basophils Relative: 0 %
Eosinophils Absolute: 0.1 10*3/uL (ref 0.0–0.5)
Eosinophils Relative: 0 %
HCT: 51.4 % — ABNORMAL HIGH (ref 36.0–46.0)
Hemoglobin: 16.2 g/dL — ABNORMAL HIGH (ref 12.0–15.0)
Immature Granulocytes: 0 %
Lymphocytes Relative: 2 %
Lymphs Abs: 0.3 10*3/uL — ABNORMAL LOW (ref 0.7–4.0)
MCH: 26.6 pg (ref 26.0–34.0)
MCHC: 31.5 g/dL (ref 30.0–36.0)
MCV: 84.5 fL (ref 80.0–100.0)
Monocytes Absolute: 0.3 10*3/uL (ref 0.1–1.0)
Monocytes Relative: 3 %
Neutro Abs: 11.5 10*3/uL — ABNORMAL HIGH (ref 1.7–7.7)
Neutrophils Relative %: 95 %
Platelets: 256 10*3/uL (ref 150–400)
RBC: 6.08 MIL/uL — ABNORMAL HIGH (ref 3.87–5.11)
RDW: 13.2 % (ref 11.5–15.5)
WBC: 12.2 10*3/uL — ABNORMAL HIGH (ref 4.0–10.5)
nRBC: 0 % (ref 0.0–0.2)

## 2019-01-10 LAB — URINALYSIS, MICROSCOPIC (REFLEX)

## 2019-01-10 LAB — URINALYSIS, ROUTINE W REFLEX MICROSCOPIC
Bilirubin Urine: NEGATIVE
Glucose, UA: >=500 mg/dL — AB
Hgb urine dipstick: NEGATIVE
Ketones, ur: 40 mg/dL — AB
Leukocytes, UA: NEGATIVE
Nitrite: NEGATIVE
Protein, ur: NEGATIVE mg/dL
Specific Gravity, Urine: 1.015 (ref 1.005–1.030)
pH: 5.5 (ref 5.0–8.0)

## 2019-01-10 LAB — LIPASE, BLOOD: Lipase: 41 U/L (ref 11–51)

## 2019-01-10 LAB — PREGNANCY, URINE: Preg Test, Ur: NEGATIVE

## 2019-01-10 MED ORDER — ONDANSETRON HCL 4 MG/2ML IJ SOLN
4.0000 mg | Freq: Once | INTRAMUSCULAR | Status: AC
  Administered 2019-01-10: 4 mg via INTRAVENOUS

## 2019-01-10 MED ORDER — LOPERAMIDE HCL 2 MG PO CAPS: 2.0000 mg | ORAL_CAPSULE | Freq: Four times a day (QID) | ORAL | 0 refills | Status: AC | PRN

## 2019-01-10 MED ORDER — IOPAMIDOL (ISOVUE-300) INJECTION 61%
100.0000 mL | Freq: Once | INTRAVENOUS | Status: AC | PRN
  Administered 2019-01-10: 100 mL via INTRAVENOUS

## 2019-01-10 MED ORDER — KETOROLAC TROMETHAMINE 30 MG/ML IJ SOLN
30.0000 mg | Freq: Once | INTRAMUSCULAR | Status: AC
  Administered 2019-01-10: 30 mg via INTRAVENOUS
  Filled 2019-01-10: qty 1

## 2019-01-10 MED ORDER — ONDANSETRON 4 MG PO TBDP: 4.0000 mg | ORAL_TABLET | Freq: Once | ORAL | Status: DC

## 2019-01-10 MED ORDER — LOPERAMIDE HCL 2 MG PO CAPS
4.0000 mg | ORAL_CAPSULE | Freq: Once | ORAL | Status: AC
  Administered 2019-01-10: 4 mg via ORAL
  Filled 2019-01-10: qty 2

## 2019-01-10 MED ORDER — ONDANSETRON HCL 4 MG/2ML IJ SOLN
INTRAMUSCULAR | Status: AC
  Administered 2019-01-10: 4 mg via INTRAVENOUS
  Filled 2019-01-10: qty 2

## 2019-01-10 MED ORDER — ONDANSETRON 4 MG PO TBDP: 4.0000 mg | ORAL_TABLET | Freq: Three times a day (TID) | ORAL | 0 refills | Status: AC | PRN

## 2019-01-10 MED ORDER — SODIUM CHLORIDE 0.9 % IV BOLUS
1000.0000 mL | Freq: Once | INTRAVENOUS | Status: AC
Start: 2019-01-10 — End: 2019-01-10
  Administered 2019-01-10: 1000 mL via INTRAVENOUS

## 2019-01-10 MED ORDER — SODIUM CHLORIDE 0.9 % IV BOLUS
1000.0000 mL | Freq: Once | INTRAVENOUS | Status: AC
  Administered 2019-01-10: 1000 mL via INTRAVENOUS

## 2019-01-10 MED FILL — SM ANTI-DIARRHEAL 2 MG CAPL: 2 | 12 days supply | Qty: 48 | Fill #0

## 2019-01-10 MED FILL — ONDANSETRON ODT 4 MG TABLET: 4 | 3 days supply | Qty: 10 | Fill #0

## 2019-01-10 NOTE — ED Provider Notes (Signed)
[facility] EMERGENCY DEPARTMENT Provider Note   CSN: [id number] Arrival date & time: 01/10/19  1209     History   Chief Complaint Chief Complaint  Patient presents with  . Emesis    HPI Mary Novak is a 43 y.o. female with history of ADHD, type 2 diabetes mellitus, ovarian cyst presenting for evaluation of acute onset, constant upper abdominal pain beginning at around 7:30 AM this morning.  She reports the pain began when she awoke and is associated with several episodes of nonbloody bilious emesis and watery nonbloody diarrhea. Pain is cramping and sharp, localized to the upper abdomen.  Denies recent travel or suspicious food intake.  Has not tried anything for her symptoms.  No fevers or chills.  Recently seen at an urgent care on 11/04/2018 and diagnosed with bronchopneumonia and discharged with doxycycline which she has been taking as prescribed.  She reports her URI symptoms and cough have improved.  She denies any chest pain or shortness of breath.  The history is provided by the patient.    Past Medical History:  Diagnosis Date  . ADHD (attention deficit hyperactivity disorder)   . Cholecystitis 02/2016  . Diabetes mellitus type 2, noninsulin dependent (HCC)   . Ovarian cyst     There are no active problems to display for this patient.   Past Surgical History:  Procedure Laterality Date  . CESAREAN SECTION  2011  . CHOLECYSTECTOMY N/A 02/28/2016   Procedure: LAPAROSCOPIC CHOLECYSTECTOMY;  Surgeon: Abigail Miyamoto, MD;  Location: [facility];  Service: General;  Laterality: N/A;     OB History    Gravida  4   Para  1   Term  1   Preterm      AB  3   Living        SAB  3   TAB      Ectopic      Multiple      Live Births  1            Home Medications    Prior to Admission medications   Medication Sig Start Date End Date Taking? Authorizing Provider  benzonatate (TESSALON) 100 MG capsule Take by mouth.  01/04/19 01/14/19 Yes [provider]  clotrimazole-betamethasone (LOTRISONE) cream APPLY TOPICALLY TWICE DAILY 12/31/18  Yes [provider]  doxycycline (VIBRA-TABS) 100 MG tablet Take by mouth. 01/04/19 01/14/19 Yes [provider]  albuterol (PROVENTIL) (2.5 MG/3ML) 0.083% nebulizer solution Take 2.5 mg by nebulization every 6 (six) hours as needed for wheezing or shortness of breath.    [provider]  amphetamine-dextroamphetamine (ADDERALL) 30 MG tablet Take 30 mg by mouth 2 (two) times daily.    [provider]  buPROPion (WELLBUTRIN XL) 300 MG 24 hr tablet Take 300 mg by mouth daily.    [provider]  butalbital-aspirin-caffeine Benny Lennert) 50-325-40 MG tablet Take 1 tablet by mouth 2 (two) times daily as needed for headache.    [provider]  cyclobenzaprine (FLEXERIL) 5 MG tablet Take 1 tablet (5 mg total) by mouth 3 (three) times daily as needed for muscle spasms. 04/22/16   Mabe, Latanya Maudlin, MD  dapagliflozin propanediol (FARXIGA) 5 MG TABS tablet Take 5 mg by mouth daily.    [provider]  desvenlafaxine (PRISTIQ) 50 MG 24 hr tablet Take 50 mg by mouth daily.    [provider]  dicyclomine (BENTYL) 20 MG tablet Take 1 tablet (20 mg total) by mouth  2 (two) times daily. 07/02/18   Dartha LodgeFord, Kelsey N, PA-C  glipiZIDE (GLUCOTROL) 5 MG tablet Take 5 mg by mouth daily before breakfast.    [provider]  ibuprofen (ADVIL,MOTRIN) 800 MG tablet Take 1 tablet (800 mg total) by mouth every 8 (eight) hours as needed for mild pain. 05/10/17   Ward, Layla MawKristen N, DO  loperamide (IMODIUM) 2 MG capsule Take 1 capsule (2 mg total) by mouth 4 (four) times daily as needed for diarrhea or loose stools. 01/10/19   Kalana Yust A, PA-C  metFORMIN (GLUCOPHAGE) 1000 MG tablet Take 1,000 mg by mouth 2 (two) times daily with a meal.    [provider]  methylPREDNISolone (MEDROL DOSEPAK) 4 MG TBPK tablet Take as directed on  package 05/11/17   Mackuen, Courteney Lyn, MD  naproxen (NAPROSYN) 500 MG tablet Take 1 tablet (500 mg total) by mouth 2 (two) times daily. 04/22/16   Mabe, Latanya MaudlinMartha L, MD  ondansetron (ZOFRAN ODT) 4 MG disintegrating tablet Take 1 tablet (4 mg total) by mouth every 8 (eight) hours as needed for nausea or vomiting. 01/10/19   Luevenia MaxinFawze, Demetrica Zipp A, PA-C  ondansetron (ZOFRAN) 4 MG tablet Take 1 tablet (4 mg total) by mouth every 6 (six) hours as needed for nausea or vomiting. 03/22/18   Dione BoozeGlick, David, MD  PRESCRIPTION MEDICATION Prescription cough meds.    [provider]  sitaGLIPtin-metformin (JANUMET) 50-1000 MG tablet Take 1 tablet by mouth 2 (two) times daily with a meal.    [provider]    Family History No family history on file.  Social History Social History   Tobacco Use  . Smoking status: Current Every Day Smoker    Packs/day: 1.00    Years: 15.00    Pack years: 15.00    Types: Cigarettes  . Smokeless tobacco: Never Used  Substance Use Topics  . Alcohol use: No  . Drug use: No     Allergies   Sulfa antibiotics   Review of Systems Review of Systems  Constitutional: Negative for chills and fever.  Respiratory: Negative for shortness of breath.   Cardiovascular: Negative for chest pain.  Gastrointestinal: Positive for abdominal pain, diarrhea, nausea and vomiting.  Genitourinary: Negative for dysuria, frequency, hematuria, urgency, vaginal bleeding, vaginal discharge and vaginal pain.  All other systems reviewed and are negative.    Physical Exam Updated Vital Signs BP 116/71   Pulse (!) 102   Temp 98 F (36.7 C) (Oral)   Resp 16   Ht 5\' 4"  (1.626 m)   Wt 131.5 kg   LMP 12/28/2018   SpO2 100%   BMI 49.78 kg/m   Physical Exam Vitals signs and nursing note reviewed.  Constitutional:      General: She is not in acute distress.    Appearance: She is well-developed.  HENT:     Head: Normocephalic and atraumatic.  Eyes:     General:        Right  eye: No discharge.        Left eye: No discharge.     Conjunctiva/sclera: Conjunctivae normal.  Neck:     Musculoskeletal: Normal range of motion and neck supple.     Vascular: No JVD.     Trachea: No tracheal deviation.  Cardiovascular:     Rate and Rhythm: Tachycardia present.     Pulses: Normal pulses.     Heart sounds: Normal heart sounds.  Pulmonary:     Effort: Pulmonary effort is normal.  Breath sounds: Normal breath sounds.  Abdominal:     General: Abdomen is flat. Bowel sounds are decreased. There is no distension.     Palpations: Abdomen is soft.     Tenderness: There is abdominal tenderness in the right upper quadrant, epigastric area and left upper quadrant. There is no guarding or rebound. Negative signs include Murphy's sign, Rovsing's sign and McBurney's sign.  Skin:    General: Skin is warm and dry.     Findings: No erythema.  Neurological:     Mental Status: She is alert.  Psychiatric:        Behavior: Behavior normal.      ED Treatments / Results  Labs (all labs ordered are listed, but only abnormal results are displayed) Labs Reviewed  COMPREHENSIVE METABOLIC PANEL - Abnormal; Notable for the following components:      Result Value   Sodium 131 (*)    CO2 21 (*)    Glucose, Bld 299 (*)    All other components within normal limits  CBC WITH DIFFERENTIAL/PLATELET - Abnormal; Notable for the following components:   WBC 12.2 (*)    RBC 6.08 (*)    Hemoglobin 16.2 (*)    HCT 51.4 (*)    Neutro Abs 11.5 (*)    Lymphs Abs 0.3 (*)    All other components within normal limits  URINALYSIS, ROUTINE W REFLEX MICROSCOPIC - Abnormal; Notable for the following components:   Glucose, UA >=500 (*)    Ketones, ur 40 (*)    All other components within normal limits  URINALYSIS, MICROSCOPIC (REFLEX) - Abnormal; Notable for the following components:   Bacteria, UA RARE (*)    All other components within normal limits  GASTROINTESTINAL PANEL BY PCR, STOOL  (REPLACES STOOL CULTURE)  LIPASE, BLOOD  PREGNANCY, URINE    EKG None  Radiology Ct Abdomen Pelvis W Contrast  Result Date: 01/10/2019 CLINICAL DATA:  43 year old female with left upper quadrant pain, nausea, vomiting and diarrhea. EXAM: CT ABDOMEN AND PELVIS WITH CONTRAST TECHNIQUE: Multidetector CT imaging of the abdomen and pelvis was performed using the standard protocol following bolus administration of intravenous contrast. CONTRAST:  ISOVUE-300 IOPAMIDOL (ISOVUE-300) INJECTION 61% COMPARISON:  Prior CT scan of the abdomen and pelvis 07/13/2018 FINDINGS: Lower chest: The lung bases are clear. Visualized cardiac structures are within normal limits for size. No pericardial effusion. Unremarkable visualized distal thoracic esophagus. Hepatobiliary: Low attenuation of the hepatic parenchyma consistent with at least moderate hepatic steatosis. No discrete hepatic lesion. The gallbladder is surgically absent. No intra or extrahepatic biliary ductal dilatation. Pancreas: Unremarkable. No pancreatic ductal dilatation or surrounding inflammatory changes. Spleen: Normal in size without focal abnormality. Adrenals/Urinary Tract: Stable 2 cm left adrenal nodule. The right adrenal gland is normal. Stomach/Bowel: No evidence of obstruction or focal bowel wall thickening. Normal appendix in the right lower quadrant. The terminal ileum is unremarkable. Vascular/Lymphatic: No significant vascular findings are present. No enlarged abdominal or pelvic lymph nodes. Reproductive: Uterus and bilateral adnexa are unremarkable. Other: No evidence of ascites. Small fat containing umbilical hernia. Musculoskeletal: No acute fracture or aggressive appearing lytic or blastic osseous lesion. Chronic bilateral L5 pars defects. No anterolisthesis. Mild degenerative disc disease. IMPRESSION: 1. No acute abnormality within the abdomen or pelvis. 2. At least moderate hepatic steatosis. 3. Stable 2 cm left adrenal nodule dating  back to 07/13/2018. 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 4. Small fat containing umbilical hernia. 5. Chronic bilateral L5 pars defects without anterolisthesis.  Electronically Signed   By: Malachy Moan M.D.   On: 01/10/2019 14:52    Procedures Procedures (including critical care time)  Medications Ordered in ED Medications  sodium chloride 0.9 % bolus 1,000 mL ( Intravenous Stopped 01/10/19 1358)  ondansetron (ZOFRAN) injection 4 mg (4 mg Intravenous Given 01/10/19 1323)  loperamide (IMODIUM) capsule 4 mg (4 mg Oral Given 01/10/19 1331)  iopamidol (ISOVUE-300) 61 % injection 100 mL (100 mLs Intravenous Contrast Given 01/10/19 1425)  sodium chloride 0.9 % bolus 1,000 mL ( Intravenous Stopped 01/10/19 1651)  ketorolac (TORADOL) 30 MG/ML injection 30 mg (30 mg Intravenous Given 01/10/19 1640)     Initial Impression / Assessment and Plan / ED Course  I have reviewed the triage vital signs and the nursing notes.  Pertinent labs & imaging results that were available during my care of the patient were reviewed by me and considered in my medical decision making (see chart for details).     Patient presenting for evaluation of nausea, vomiting, and diarrhea since early this morning.  The patient is afebrile, tachycardic in the ED with improvement on reevaluation.  Lab work reviewed by me shows leukocytosis and elevated hemoglobin and hematocrit suggesting dehydration.  She is mildly hyponatremic with a sodium of 131 and hyperglycemic with a blood glucose of 299.  UA does not suggest UTI or nephrolithiasis but does suggest dehydration with ketonuria.  She was unable to give [place] a stool sample while in the ED.  CT of the abdomen pelvis shows no acute surgical abdominal pathology.  She does have hepatic steatosis and a stable 2 cm left adrenal nodule that is consistent with benign adenoma as well as a small fat-containing umbilical hernia.  I did inform the patient of incidental findings  on her imaging today.  She was given IV fluids and antiemetics in the ED with improvement.  On reevaluation she is tolerating p.o. food and fluids without difficulty and reports she is feeling much better.  Serial abdominal examinations benign.  Consider the possibility of C. difficile infection though the patient was unable to give [place] a stool sample while in the ED.  I did recommend she follow-up with her PCP for stool sample and further testing.  I encouraged her to stop taking her doxycycline as she has been taking the medication for approximately 7 days and her chest x-ray did not show any sign of pneumonia so I am unsure if she truly required antibiotic treatment in the first place.  No evidence of acute surgical abdominal pathology.  Differential considerations include gastroenteritis versus C. difficile infection versus side effect from her antibiotic.  Discussed advancing diet slowly and pushing fluids, recommend follow-up with PCP for reevaluation of symptoms.  Discussed strict ED return precautions Pt verbalized understanding of and agreement with plan and is safe for discharge home at this time.  Final Clinical Impressions(s) / ED Diagnoses   Final diagnoses:  Nausea vomiting and diarrhea  Pain of upper abdomen    ED Discharge Orders         Ordered    loperamide (IMODIUM) 2 MG capsule  4 times daily PRN     01/10/19 1703    ondansetron (ZOFRAN ODT) 4 MG disintegrating tablet  Every 8 hours PRN     01/10/19 1703           Bennye Alm 01/10/19 2040    Tilden Fossa, MD 01/11/19 1914

## 2019-01-10 NOTE — Discharge Instructions (Addendum)
1. Medications: Stop taking your antibiotic.  Alternate 600 mg of ibuprofen and [phone] mg of Tylenol every 3 hours as needed for pain/fever. Do not exceed 4000 mg of Tylenol daily.  Take ibuprofen with food to avoid upset stomach.  Take Zofran as needed for nausea. Let this medication dissolve under your tongue.  Wait around 20 minutes before eating or drinking after taking this medication.  Start taking Imodium as needed for diarrhea. 2. Treatment: rest, drink plenty of fluids, advance diet slowly.  Start with water and broth then advance to bland foods that will not upset your stomach such as crackers, mashed potatoes, and peanut butter. 3. Follow Up: Please followup with your primary doctor in 3 days for discussion of your diagnoses and further evaluation after today's visit; if you do not have a primary care doctor use the resource guide provided to find one; Please return to the ER for persistent vomiting, high fevers or worsening symptoms

## 2019-01-10 NOTE — ED Notes (Signed)
ED Provider at bedside. 

## 2019-01-10 NOTE — ED Triage Notes (Addendum)
C/o n/v/d started this am-NAD-to triage in w/c-taking doxy for bronchitis

## 2019-01-10 NOTE — ED Notes (Signed)
Pt tolerated 4 cups of water and 5 crackers. No emesis or diarrhea.

## 2019-01-10 NOTE — ED Notes (Signed)
Pt drinking oral contrast.

## 2019-01-12 ENCOUNTER — Other Ambulatory Visit: Payer: Self-pay

## 2019-01-12 ENCOUNTER — Emergency Department (HOSPITAL_BASED_OUTPATIENT_CLINIC_OR_DEPARTMENT_OTHER)
Admission: EM | Admit: 2019-01-12 | Discharge: 2019-01-12 | Disposition: A | Discharge status: Home / Self Care | Payer: Medicare Other | Attending: Emergency Medicine | Admitting: Emergency Medicine

## 2019-01-12 ENCOUNTER — Encounter (HOSPITAL_BASED_OUTPATIENT_CLINIC_OR_DEPARTMENT_OTHER): Payer: Self-pay

## 2019-01-12 DIAGNOSIS — R110 Nausea: Secondary | ICD-10-CM | POA: Diagnosis not present

## 2019-01-12 DIAGNOSIS — Z79899 Other long term (current) drug therapy: Secondary | ICD-10-CM | POA: Diagnosis not present

## 2019-01-12 DIAGNOSIS — R1030 Lower abdominal pain, unspecified: Secondary | ICD-10-CM | POA: Diagnosis present

## 2019-01-12 DIAGNOSIS — R109 Unspecified abdominal pain: Secondary | ICD-10-CM

## 2019-01-12 DIAGNOSIS — Z7984 Long term (current) use of oral hypoglycemic drugs: Secondary | ICD-10-CM | POA: Insufficient documentation

## 2019-01-12 DIAGNOSIS — F17210 Nicotine dependence, cigarettes, uncomplicated: Secondary | ICD-10-CM | POA: Diagnosis not present

## 2019-01-12 DIAGNOSIS — E119 Type 2 diabetes mellitus without complications: Secondary | ICD-10-CM | POA: Insufficient documentation

## 2019-01-12 LAB — URINALYSIS, ROUTINE W REFLEX MICROSCOPIC
Bilirubin Urine: NEGATIVE
Glucose, UA: >=500 mg/dL — AB
Hgb urine dipstick: NEGATIVE
Ketones, ur: NEGATIVE mg/dL
Leukocytes, UA: NEGATIVE
Nitrite: NEGATIVE
PH: 6 (ref 5.0–8.0)
Protein, ur: NEGATIVE mg/dL
Specific Gravity, Urine: 1.01 (ref 1.005–1.030)

## 2019-01-12 LAB — CBC
HEMATOCRIT: 45.4 % (ref 36.0–46.0)
Hemoglobin: 14 g/dL (ref 12.0–15.0)
MCH: 26.5 pg (ref 26.0–34.0)
MCHC: 30.8 g/dL (ref 30.0–36.0)
MCV: 85.8 fL (ref 80.0–100.0)
Platelets: 240 10*3/uL (ref 150–400)
RBC: 5.29 MIL/uL — ABNORMAL HIGH (ref 3.87–5.11)
RDW: 13.2 % (ref 11.5–15.5)
WBC: 6.9 10*3/uL (ref 4.0–10.5)
nRBC: 0 % (ref 0.0–0.2)

## 2019-01-12 LAB — URINALYSIS, MICROSCOPIC (REFLEX)

## 2019-01-12 LAB — COMPREHENSIVE METABOLIC PANEL
ALT: 36 U/L (ref 0–44)
AST: 29 U/L (ref 15–41)
Albumin: 3.9 g/dL (ref 3.5–5.0)
Alkaline Phosphatase: 46 U/L (ref 38–126)
Anion gap: 7 (ref 5–15)
BUN: 7 mg/dL (ref 6–20)
CO2: 24 mmol/L (ref 22–32)
Calcium: 8.1 mg/dL — ABNORMAL LOW (ref 8.9–10.3)
Chloride: 103 mmol/L (ref 98–111)
Creatinine, Ser: 0.52 mg/dL (ref 0.44–1.00)
GFR calc Af Amer: >60 mL/min (ref >60)
GFR calc non Af Amer: >60 mL/min (ref >60)
Glucose, Bld: 118 mg/dL — ABNORMAL HIGH (ref 70–99)
Potassium: 3.3 mmol/L — ABNORMAL LOW (ref 3.5–5.1)
Sodium: 134 mmol/L — ABNORMAL LOW (ref 135–145)
Total Bilirubin: 0.5 mg/dL (ref 0.3–1.2)
Total Protein: 7.1 g/dL (ref 6.5–8.1)

## 2019-01-12 LAB — PREGNANCY, URINE: Preg Test, Ur: NEGATIVE

## 2019-01-12 LAB — LIPASE, BLOOD: Lipase: 31 U/L (ref 11–51)

## 2019-01-12 MED ORDER — SODIUM CHLORIDE 0.9% FLUSH
3.0000 mL | Freq: Once | INTRAVENOUS | Status: AC
  Administered 2019-01-12: 3 mL via INTRAVENOUS
  Filled 2019-01-12: qty 3

## 2019-01-12 MED ORDER — DICYCLOMINE HCL 10 MG PO CAPS
20.0000 mg | ORAL_CAPSULE | Freq: Once | ORAL | Status: AC
  Administered 2019-01-12: 20 mg via ORAL
  Filled 2019-01-12: qty 2

## 2019-01-12 MED ORDER — DICYCLOMINE HCL 20 MG PO TABS: 20.0000 mg | ORAL_TABLET | Freq: Two times a day (BID) | ORAL | 0 refills | Status: AC

## 2019-01-12 NOTE — ED Provider Notes (Signed)
[facility] EMERGENCY DEPARTMENT Provider Note   CSN: [id number] Arrival date & time: 01/12/19  1629     History   Chief Complaint Chief Complaint  Patient presents with  . Abdominal Pain    HPI Mary Novak is a 43 y.o. female.  HPI  Mary Novak is a 43 y.o. female, with a history of DM and cholecystectomy, presenting to the ED with nausea and abdominal cramping recurring today.   Patient was seen in the ED January 20 with abdominal pain, vomiting, and diarrhea.  CT without acute abnormality.  Lab results suggested dehydration.  Discharged with loperamide, Zofran, and instructions for home management. She notes her cramping today is in the central and lower abdomen, intermittent, moderate to severe, nonradiating.  She took a dose of Zofran prior to arrival.  Denies fever/chills, vomiting, diarrhea, shortness of breath, chest pain, flank/back pain, hematochezia/melena, syncope, or any other complaints.   Past Medical History:  Diagnosis Date  . ADHD (attention deficit hyperactivity disorder)   . Cholecystitis 02/2016  . Diabetes mellitus type 2, noninsulin dependent (HCC)   . Ovarian cyst     There are no active problems to display for this patient.   Past Surgical History:  Procedure Laterality Date  . CESAREAN SECTION  2011  . CHOLECYSTECTOMY N/A 02/28/2016   Procedure: LAPAROSCOPIC CHOLECYSTECTOMY;  Surgeon: Abigail Miyamotoouglas Blackman, MD;  Location: [facility];  Service: General;  Laterality: N/A;     OB History    Gravida  4   Para  1   Term  1   Preterm      AB  3   Living        SAB  3   TAB      Ectopic      Multiple      Live Births  1            Home Medications    Prior to Admission medications   Medication Sig Start Date End Date Taking? Authorizing Provider  albuterol (PROVENTIL) (2.5 MG/3ML) 0.083% nebulizer solution Take 2.5 mg by nebulization every 6 (six) hours as needed for wheezing or  shortness of breath.   Yes [provider]  buPROPion (WELLBUTRIN XL) 300 MG 24 hr tablet Take 300 mg by mouth daily.   Yes [provider]  clotrimazole-betamethasone (LOTRISONE) cream APPLY TOPICALLY TWICE DAILY 12/31/18  Yes [provider]  dapagliflozin propanediol (FARXIGA) 5 MG TABS tablet Take 5 mg by mouth daily.   Yes [provider]  desvenlafaxine (PRISTIQ) 50 MG 24 hr tablet Take 50 mg by mouth daily.   Yes [provider]  glipiZIDE (GLUCOTROL) 5 MG tablet Take 5 mg by mouth daily before breakfast.   Yes [provider]  loperamide (IMODIUM) 2 MG capsule Take 1 capsule (2 mg total) by mouth 4 (four) times daily as needed for diarrhea or loose stools. 01/10/19  Yes Fawze, Mina A, PA-C  ondansetron (ZOFRAN ODT) 4 MG disintegrating tablet Take 1 tablet (4 mg total) by mouth every 8 (eight) hours as needed for nausea or vomiting. 01/10/19  Yes Fawze, Mina A, PA-C  sitaGLIPtin-metformin (JANUMET) 50-1000 MG tablet Take 1 tablet by mouth 2 (two) times daily with a meal.   Yes [provider]  amphetamine-dextroamphetamine (ADDERALL) 30 MG tablet Take 30 mg by mouth 2 (two) times daily.    [provider]  benzonatate (TESSALON) 100 MG capsule Take by mouth. 01/04/19 01/14/19  [provider]  butalbital-aspirin-caffeine (FIORINAL) 50-325-40 MG tablet Take 1 tablet by mouth 2 (two) times daily as needed for headache.    [provider]  cyclobenzaprine (FLEXERIL) 5 MG tablet Take 1 tablet (5 mg total) by mouth 3 (three) times daily as needed for muscle spasms. 04/22/16   Mabe, Latanya Maudlin, MD  dicyclomine (BENTYL) 20 MG tablet Take 1 tablet (20 mg total) by mouth 2 (two) times daily. 01/12/19   Yvonne Stopher C, PA-C  ibuprofen (ADVIL,MOTRIN) 800 MG tablet Take 1 tablet (800 mg total) by mouth every 8 (eight) hours as needed for mild pain. 05/10/17   Ward, Layla Maw, DO  ondansetron (ZOFRAN) 4 MG tablet Take 1 tablet (4  mg total) by mouth every 6 (six) hours as needed for nausea or vomiting. 03/22/18   Dione Booze, MD  PRESCRIPTION MEDICATION Prescription cough meds.    [provider]    Family History No family history on file.  Social History Social History   Tobacco Use  . Smoking status: Current Every Day Smoker    Packs/day: 1.00    Years: 15.00    Pack years: 15.00    Types: Cigarettes  . Smokeless tobacco: Never Used  Substance Use Topics  . Alcohol use: No  . Drug use: No     Allergies   Sulfa antibiotics   Review of Systems Review of Systems  Constitutional: Negative for chills, diaphoresis and fever.  Respiratory: Negative for shortness of breath.   Cardiovascular: Negative for chest pain.  Gastrointestinal: Positive for abdominal pain and nausea. Negative for blood in stool, diarrhea and vomiting.  Genitourinary: Negative for dysuria, flank pain, frequency, hematuria, vaginal bleeding and vaginal discharge.  Musculoskeletal: Negative for back pain.  Neurological: Negative for syncope and weakness.  All other systems reviewed and are negative.    Physical Exam Updated Vital Signs BP 111/74 (BP Location: Right Arm)   Pulse 87   Temp 98.1 F (36.7 C) (Oral)   Resp 20   LMP 12/28/2018   SpO2 99%   Physical Exam Vitals signs and nursing note reviewed.  Constitutional:      General: She is not in acute distress.    Appearance: She is well-developed. She is not diaphoretic.  HENT:     Head: Normocephalic and atraumatic.     Mouth/Throat:     Mouth: Mucous membranes are moist.     Pharynx: Oropharynx is clear.  Eyes:     Conjunctiva/sclera: Conjunctivae normal.  Neck:     Musculoskeletal: Neck supple.  Cardiovascular:     Rate and Rhythm: Normal rate and regular rhythm.     Pulses: Normal pulses.     Heart sounds: Normal heart sounds.  Pulmonary:     Effort: Pulmonary effort is normal. No respiratory distress.     Breath sounds: Normal breath sounds.    Abdominal:     Palpations: Abdomen is soft.     Tenderness: There is no abdominal tenderness. There is no right CVA tenderness, left CVA tenderness or guarding.     Comments: No tenderness throughout the abdomen.  Musculoskeletal:     Right lower leg: No edema.     Left lower leg: No edema.  Lymphadenopathy:     Cervical: No cervical adenopathy.  Skin:    General: Skin is warm and dry.  Neurological:     Mental Status: She is alert.  Psychiatric:        Mood and Affect: Mood and affect normal.  Speech: Speech normal.        Behavior: Behavior normal.      ED Treatments / Results  Labs (all labs ordered are listed, but only abnormal results are displayed) Labs Reviewed  COMPREHENSIVE METABOLIC PANEL - Abnormal; Notable for the following components:      Result Value   Sodium 134 (*)    Potassium 3.3 (*)    Glucose, Bld 118 (*)    Calcium 8.1 (*)    All other components within normal limits  CBC - Abnormal; Notable for the following components:   RBC 5.29 (*)    All other components within normal limits  URINALYSIS, ROUTINE W REFLEX MICROSCOPIC - Abnormal; Notable for the following components:   Glucose, UA >=500 (*)    All other components within normal limits  URINALYSIS, MICROSCOPIC (REFLEX) - Abnormal; Notable for the following components:   Bacteria, UA RARE (*)    All other components within normal limits  LIPASE, BLOOD  PREGNANCY, URINE    EKG None  Radiology No results found.  Procedures Procedures (including critical care time)  Medications Ordered in ED Medications  sodium chloride flush (NS) 0.9 % injection 3 mL (3 mLs Intravenous Given 01/12/19 1735)  dicyclomine (BENTYL) capsule 20 mg (20 mg Oral Given 01/12/19 1824)     Initial Impression / Assessment and Plan / ED Course  I have reviewed the triage vital signs and the nursing notes.  Pertinent labs & imaging results that were available during my care of the patient were reviewed by me  and considered in my medical decision making (see chart for details).     Patient presents with episode of abdominal cramping and nausea.  Patient is nontoxic appearing, afebrile, not tachycardic, not tachypneic, not hypotensive, maintains excellent SPO2 on room air, and is in no apparent distress. No acute abnormalities on CT 2 days ago.  Lab work today has improved when compared to values from 2 days ago.  Tolerating PO fluids.  We discussed expectations for course of illness.  The patient was given instructions for home care as well as return precautions. Patient voices understanding of these instructions, accepts the plan, and is comfortable with discharge.   Vitals:   01/12/19 1639 01/12/19 1856  BP: 111/74 111/79  Pulse: 87 73  Resp: 20 16  Temp: 98.1 F (36.7 C)   TempSrc: Oral   SpO2: 99% 100%      Final Clinical Impressions(s) / ED Diagnoses   Final diagnoses:  Abdominal cramping  Nausea    ED Discharge Orders         Ordered    dicyclomine (BENTYL) 20 MG tablet  2 times daily     01/12/19 1933           Concepcion LivingJoy, Audry Kauzlarich C, PA-C 01/13/19 0049    Alvira MondaySchlossman, Erin, MD 01/14/19 1943

## 2019-01-12 NOTE — ED Notes (Signed)
ED Provider at bedside. 

## 2019-01-12 NOTE — ED Notes (Signed)
Pt ambo to restroom for urine sample.

## 2019-01-12 NOTE — ED Notes (Signed)
Pt given water and ice chips.

## 2019-01-12 NOTE — ED Triage Notes (Signed)
C/o cont'd abd pain-seen here 2 days ago for same-NAD-to triage in w/c

## 2019-01-12 NOTE — Discharge Instructions (Signed)
Nausea, Vomiting, and Diarrhea ° °Hand washing: Wash your hands throughout the day, but especially before and after touching the face, using the restroom, sneezing, coughing, or touching surfaces that have been coughed or sneezed upon. °Hydration: Symptoms will be intensified and complicated by dehydration. Dehydration can also extend the duration of symptoms. Drink plenty of fluids and get plenty of rest. You should be drinking at least half a liter of water an hour to stay hydrated. Electrolyte drinks (ex. Gatorade, Powerade, Pedialyte) are also encouraged. You should be drinking enough fluids to make your urine light yellow, almost clear. If this is not the case, you are not drinking enough water. °Please note that some of the treatments indicated below will not be effective if you are not adequately hydrated. °Diet: Please concentrate on hydration, however, you may introduce food slowly.  Start with a clear liquid diet, progressed to a full liquid diet, and then bland solids as you are able. °Pain or fever: Ibuprofen, Naproxen, or Tylenol for pain or fever.  °Nausea/vomiting: Use the Zofran for nausea or vomiting. °Diarrhea: May use medications such as loperamide (Imodium) or Bismuth subsalicylate (Pepto-Bismol). °Bentyl: This medication is what is known as an antispasmodic and is intended to help reduce abdominal discomfort. °Follow-up: Follow-up with a primary care provider on this matter. °Return: Return should you develop a fever, bloody diarrhea, increased abdominal pain, uncontrolled vomiting, or any other major concerns. ° °For prescription assistance, may try using prescription discount sites or apps, such as goodrx.com °

## 2021-10-22 ENCOUNTER — Encounter (HOSPITAL_BASED_OUTPATIENT_CLINIC_OR_DEPARTMENT_OTHER): Payer: Self-pay

## 2021-10-22 ENCOUNTER — Emergency Department (HOSPITAL_BASED_OUTPATIENT_CLINIC_OR_DEPARTMENT_OTHER)
Admission: EM | Admit: 2021-10-22 | Discharge: 2021-10-22 | Disposition: A | Discharge status: Home / Self Care | Payer: Medicare Other | Attending: Emergency Medicine | Admitting: Emergency Medicine

## 2021-10-22 ENCOUNTER — Other Ambulatory Visit (HOSPITAL_BASED_OUTPATIENT_CLINIC_OR_DEPARTMENT_OTHER): Payer: Self-pay

## 2021-10-22 ENCOUNTER — Other Ambulatory Visit: Payer: Self-pay

## 2021-10-22 ENCOUNTER — Emergency Department (HOSPITAL_BASED_OUTPATIENT_CLINIC_OR_DEPARTMENT_OTHER): Payer: Medicare Other

## 2021-10-22 DIAGNOSIS — Z7982 Long term (current) use of aspirin: Secondary | ICD-10-CM | POA: Diagnosis not present

## 2021-10-22 DIAGNOSIS — Z7984 Long term (current) use of oral hypoglycemic drugs: Secondary | ICD-10-CM | POA: Insufficient documentation

## 2021-10-22 DIAGNOSIS — M25511 Pain in right shoulder: Secondary | ICD-10-CM | POA: Diagnosis not present

## 2021-10-22 DIAGNOSIS — E119 Type 2 diabetes mellitus without complications: Secondary | ICD-10-CM | POA: Insufficient documentation

## 2021-10-22 DIAGNOSIS — F17210 Nicotine dependence, cigarettes, uncomplicated: Secondary | ICD-10-CM | POA: Insufficient documentation

## 2021-10-22 MED ORDER — CYCLOBENZAPRINE HCL 10 MG PO TABS
10.0000 mg | ORAL_TABLET | Freq: Two times a day (BID) | ORAL | 0 refills | Status: AC | PRN
  Filled 2021-10-22: qty 20, 10d supply, fill #0

## 2021-10-22 MED ORDER — KETOROLAC TROMETHAMINE 60 MG/2ML IM SOLN
60.0000 mg | Freq: Once | INTRAMUSCULAR | Status: AC
  Administered 2021-10-22: 60 mg via INTRAMUSCULAR
  Filled 2021-10-22: qty 2

## 2021-10-22 NOTE — ED Provider Notes (Signed)
[facility] EMERGENCY DEPARTMENT Provider Note   CSN: [id number] Arrival date & time: 10/22/21  1215     History Chief Complaint  Patient presents with   Arm Pain    Mary Novak is a 45 y.o. female.  The history is provided by the patient.  Arm Pain This is a new problem. The current episode started more than 2 days ago. The problem occurs daily. The problem has not changed since onset.Pertinent negatives include no chest pain, no abdominal pain, no headaches and no shortness of breath. Exacerbated by: movement. The symptoms are relieved by rest. She has tried acetaminophen for the symptoms. The treatment provided mild relief.      Past Medical History:  Diagnosis Date   ADHD (attention deficit hyperactivity disorder)    Cholecystitis 02/2016   Diabetes mellitus type 2, noninsulin dependent (HCC)    Ovarian cyst     There are no problems to display for this patient.   Past Surgical History:  Procedure Laterality Date   CESAREAN SECTION  2011   CHOLECYSTECTOMY N/A 02/28/2016   Procedure: LAPAROSCOPIC CHOLECYSTECTOMY;  Surgeon: Abigail Miyamoto, MD;  Location: [facility];  Service: General;  Laterality: N/A;     OB History     Gravida  4   Para  1   Term  1   Preterm      AB  3   Living         SAB  3   IAB      Ectopic      Multiple      Live Births  1           History reviewed. No pertinent family history.  Social History   Tobacco Use   Smoking status: Every Day    Packs/day: 1.00    Years: 15.00    Pack years: 15.00    Types: Cigarettes   Smokeless tobacco: Never  Vaping Use   Vaping Use: Never used  Substance Use Topics   Alcohol use: No   Drug use: No    Home Medications Prior to Admission medications   Medication Sig Start Date End Date Taking? Authorizing Provider  cyclobenzaprine (FLEXERIL) 10 MG tablet Take 1 tablet (10 mg total) by mouth 2 (two) times daily as needed for muscle spasms.  10/22/21  Yes Patience Nuzzo, DO  albuterol (PROVENTIL) (2.5 MG/3ML) 0.083% nebulizer solution Take 2.5 mg by nebulization every 6 (six) hours as needed for wheezing or shortness of breath.    [provider]  amphetamine-dextroamphetamine (ADDERALL) 30 MG tablet Take 30 mg by mouth 2 (two) times daily.    [provider]  buPROPion (WELLBUTRIN XL) 300 MG 24 hr tablet Take 300 mg by mouth daily.    [provider]  butalbital-aspirin-caffeine Benny Lennert) 50-325-40 MG tablet Take 1 tablet by mouth 2 (two) times daily as needed for headache.    [provider]  clotrimazole-betamethasone (LOTRISONE) cream APPLY TOPICALLY TWICE DAILY 12/31/18   [provider]  dapagliflozin propanediol (FARXIGA) 5 MG TABS tablet Take 5 mg by mouth daily.    [provider]  desvenlafaxine (PRISTIQ) 50 MG 24 hr tablet Take 50 mg by mouth daily.    [provider]  dicyclomine (BENTYL) 20 MG tablet Take 1 tablet (20 mg total) by mouth 2 (two) times daily. 01/12/19   Joy, Shawn C, PA-C  glipiZIDE (GLUCOTROL) 5 MG tablet Take 5 mg by mouth daily before breakfast.  [provider]  ibuprofen (ADVIL,MOTRIN) 800 MG tablet Take 1 tablet (800 mg total) by mouth every 8 (eight) hours as needed for mild pain. 05/10/17   Ward, Layla Maw, DO  loperamide (IMODIUM) 2 MG capsule Take 1 capsule (2 mg total) by mouth 4 (four) times daily as needed for diarrhea or loose stools. 01/10/19   Luevenia Maxin, Mina A, PA-C  ondansetron (ZOFRAN ODT) 4 MG disintegrating tablet Take 1 tablet (4 mg total) by mouth every 8 (eight) hours as needed for nausea or vomiting. 01/10/19   Luevenia Maxin, Mina A, PA-C  ondansetron (ZOFRAN) 4 MG tablet Take 1 tablet (4 mg total) by mouth every 6 (six) hours as needed for nausea or vomiting. 03/22/18   Dione Booze, MD  PRESCRIPTION MEDICATION Prescription cough meds.    [provider]  sitaGLIPtin-metformin (JANUMET) 50-1000 MG tablet Take 1 tablet by  mouth 2 (two) times daily with a meal.    [provider]    Allergies    Sulfa antibiotics  Review of Systems   Review of Systems  Constitutional:  Negative for fever.  Respiratory:  Negative for shortness of breath.   Cardiovascular:  Negative for chest pain.  Gastrointestinal:  Negative for abdominal pain.  Musculoskeletal:  Positive for arthralgias. Negative for back pain, joint swelling, neck pain and neck stiffness.  Skin:  Negative for wound.  Neurological:  Negative for weakness, numbness and headaches.   Physical Exam Updated Vital Signs BP 121/87 (BP Location: Left Arm)   Pulse 90   Temp 98.2 F (36.8 C) (Oral)   Resp 20   Ht 5\' 4"  (1.626 m)   Wt 118.4 kg   LMP 10/05/2021 (Approximate)   SpO2 100%   BMI 44.80 kg/m   Physical Exam Constitutional:      General: She is not in acute distress.    Appearance: She is not ill-appearing.  Musculoskeletal:        General: Tenderness present. No swelling. Normal range of motion.     Cervical back: Normal range of motion and neck supple. No tenderness.     Comments: Tenderness in the right anterior shoulder and with flexion extension at the elbow with supination.  Skin:    General: Skin is warm.     Capillary Refill: Capillary refill takes less than 2 seconds.  Neurological:     General: No focal deficit present.     Mental Status: She is alert.     Sensory: No sensory deficit.     Motor: No weakness.    ED Results / Procedures / Treatments   Labs (all labs ordered are listed, but only abnormal results are displayed) Labs Reviewed - No data to display  EKG None  Radiology DG Shoulder Right  Result Date: 10/22/2021 CLINICAL DATA:  Pain EXAM: RIGHT SHOULDER - 2+ VIEW COMPARISON:  None. FINDINGS: No fracture or dislocation is seen. There are no focal lytic lesions. There are no abnormal soft tissue calcifications. Degenerative changes are noted in the [facility] joint with bony spurs. IMPRESSION: No fracture or  dislocation is seen. No abnormal soft tissue calcifications are seen. Degenerative changes are noted with bony spurs in the right AC joint. Electronically Signed   By: [facility] M.D[facility]   On: 10/22/2021 13:36    Procedures Procedures   Medications Ordered in ED Medications  ketorolac (TORADOL) injection 60 mg (60 mg Intramuscular Given 10/22/21 1339)    ED Course  I have reviewed the triage vital signs and the  nursing notes.  Pertinent labs & imaging results that were available during my care of the patient were reviewed by me and considered in my medical decision making (see chart for details).    MDM Rules/Calculators/A&P                           KIMBERLEIGH MEHAN Is here with right shoulder pain.  Normal vitals.  No fever.  Denies any trauma history.  Pain appears to be mostly in the right anterior shoulder in the bicipital groove.  Pain is worse with movement of the right shoulder.  Pain with supination.  Suspect may be arthritis/tendinitis.  X-ray negative for fracture.  There are some degenerative changes within the right AC joint.  We will place her in a sling.  Patient was given a Toradol shot.  Recommend Tylenol, ibuprofen, ice.  Will give muscle relaxant as suspect it could be spasm of the bicep muscle possibly as well.  We will have her follow-up with sports medicine.  Discharged in good condition.  No neck pain.  Neurovascularly neuromuscularly intact.  This chart was dictated using voice recognition software.  Despite best efforts to proofread,  errors can occur which can change the documentation meaning.   Final Clinical Impression(s) / ED Diagnoses Final diagnoses:  Acute pain of right shoulder    Rx / DC Orders ED Discharge Orders          Ordered    cyclobenzaprine (FLEXERIL) 10 MG tablet  2 times daily PRN        10/22/21 1349             Leonila Speranza, DO 10/22/21 1350

## 2021-10-22 NOTE — ED Triage Notes (Signed)
Pt c/o right arm pain x 3 days. Denies known injury. Been taking 800 mg ibuprofren BID. Pain with movement of shoulder.

## 2021-10-22 NOTE — Discharge Instructions (Addendum)
X-ray negative for fracture.  Overall suspect this is from overuse/arthritis.  There are arthritic changes in your right shoulder.  Recommend taking 1000 mg of Tylenol every 6 hours as needed for pain.  Recommend taking 400 mg ibuprofen every 8 hours as needed for pain.  Try muscle relaxant to see if that helps as well as discussed this can make you sedated.  Please use mostly at nighttime.

## 2022-08-18 ENCOUNTER — Other Ambulatory Visit: Payer: Self-pay

## 2022-08-18 ENCOUNTER — Encounter (HOSPITAL_BASED_OUTPATIENT_CLINIC_OR_DEPARTMENT_OTHER): Payer: Self-pay | Admitting: Emergency Medicine

## 2022-08-18 ENCOUNTER — Emergency Department (HOSPITAL_BASED_OUTPATIENT_CLINIC_OR_DEPARTMENT_OTHER)
Admission: EM | Admit: 2022-08-18 | Discharge: 2022-08-18 | Disposition: A | Discharge status: Home / Self Care | Payer: Medicare Other | Attending: Emergency Medicine | Admitting: Emergency Medicine

## 2022-08-18 DIAGNOSIS — M62838 Other muscle spasm: Secondary | ICD-10-CM | POA: Insufficient documentation

## 2022-08-18 DIAGNOSIS — M542 Cervicalgia: Secondary | ICD-10-CM | POA: Diagnosis present

## 2022-08-18 MED ORDER — NAPROXEN 500 MG PO TABS: 500.0000 mg | ORAL_TABLET | Freq: Two times a day (BID) | ORAL | 0 refills | Status: AC

## 2022-08-18 MED ORDER — LIDOCAINE 5 % EX PTCH: 2.0000 | MEDICATED_PATCH | CUTANEOUS | Status: DC

## 2022-08-18 MED ORDER — METHOCARBAMOL 500 MG PO TABS
1000.0000 mg | ORAL_TABLET | Freq: Once | ORAL | Status: AC
Start: 2022-08-18 — End: 2022-08-18
  Administered 2022-08-18: 1000 mg via ORAL
  Filled 2022-08-18: qty 2

## 2022-08-18 MED ORDER — LIDOCAINE 5 % EX PTCH
1.0000 | MEDICATED_PATCH | CUTANEOUS | Status: DC
  Administered 2022-08-18: 1 via TRANSDERMAL
  Filled 2022-08-18: qty 1

## 2022-08-18 MED ORDER — LIDOCAINE 5 % EX PTCH: 1.0000 | MEDICATED_PATCH | CUTANEOUS | 0 refills | Status: AC

## 2022-08-18 MED ORDER — ACETAMINOPHEN 500 MG PO TABS
1000.0000 mg | ORAL_TABLET | Freq: Once | ORAL | Status: AC
  Administered 2022-08-18: 1000 mg via ORAL
  Filled 2022-08-18: qty 2

## 2022-08-18 MED ORDER — NAPROXEN 250 MG PO TABS
500.0000 mg | ORAL_TABLET | ORAL | Status: AC
  Administered 2022-08-18: 500 mg via ORAL
  Filled 2022-08-18: qty 2

## 2022-08-18 MED ORDER — METHOCARBAMOL 500 MG PO TABS: 500.0000 mg | ORAL_TABLET | Freq: Two times a day (BID) | ORAL | 0 refills | Status: AC

## 2022-08-18 NOTE — ED Provider Notes (Signed)
[facility] Provider Note   CSN: [id number] Arrival date & time: 08/18/22  0544     History  Chief Complaint  Patient presents with   Neck Pain    Mary Novak is a 46 y.o. female.  The history is provided by the patient.  Neck Pain Pain location:  L side Quality:  Cramping and stiffness Stiffness is present:  All day Pain radiates to:  Does not radiate Pain severity:  Severe Pain is:  Same all the time Onset quality:  Sudden Duration:  3 days Timing:  Constant Progression:  Unchanged Chronicity:  New Context: not lifting a heavy object, not MVC and not recent injury   Relieved by:  Nothing Exacerbated by: movement of the neck side to side. Ineffective treatments:  Heat Associated symptoms: no chest pain, no fever, no headaches and no weakness   Risk factors: no hx of head and neck radiation and no hx of spinal trauma        Home Medications Prior to Admission medications   Medication Sig Start Date End Date Taking? Authorizing Provider  lidocaine (LIDODERM) 5 % Place 1 patch onto the skin daily. Remove & Discard patch within 12 hours or as directed by MD 08/18/22  Yes Daizee Firmin, MD  methocarbamol (ROBAXIN) 500 MG tablet Take 1 tablet (500 mg total) by mouth 2 (two) times daily. 08/18/22  Yes Avien Taha, MD  naproxen (NAPROSYN) 500 MG tablet Take 1 tablet (500 mg total) by mouth 2 (two) times daily with a meal. 08/18/22  Yes Cesia Orf, MD  albuterol (PROVENTIL) (2.5 MG/3ML) 0.083% nebulizer solution Take 2.5 mg by nebulization every 6 (six) hours as needed for wheezing or shortness of breath.    [provider]  amphetamine-dextroamphetamine (ADDERALL) 30 MG tablet Take 30 mg by mouth 2 (two) times daily.    [provider]  buPROPion (WELLBUTRIN XL) 300 MG 24 hr tablet Take 300 mg by mouth daily.    [provider]  butalbital-aspirin-caffeine Acquanetta Chain) 50-325-40 MG tablet Take 1 tablet by  mouth 2 (two) times daily as needed for headache.    [provider]  clotrimazole-betamethasone (LOTRISONE) cream APPLY TOPICALLY TWICE DAILY 12/31/18   [provider]  cyclobenzaprine (FLEXERIL) 10 MG tablet Take 1 tablet (10 mg total) by mouth 2 (two) times daily as needed for muscle spasms. 10/22/21   Curatolo, Adam, DO  dapagliflozin propanediol (FARXIGA) 5 MG TABS tablet Take 5 mg by mouth daily.    [provider]  desvenlafaxine (PRISTIQ) 50 MG 24 hr tablet Take 50 mg by mouth daily.    [provider]  dicyclomine (BENTYL) 20 MG tablet Take 1 tablet (20 mg total) by mouth 2 (two) times daily. 01/12/19   Joy, Shawn C, PA-C  glipiZIDE (GLUCOTROL) 5 MG tablet Take 5 mg by mouth daily before breakfast.    [provider]  ibuprofen (ADVIL,MOTRIN) 800 MG tablet Take 1 tablet (800 mg total) by mouth every 8 (eight) hours as needed for mild pain. 05/10/17   Ward, Delice Bison, DO  loperamide (IMODIUM) 2 MG capsule Take 1 capsule (2 mg total) by mouth 4 (four) times daily as needed for diarrhea or loose stools. 01/10/19   Nils Flack, Mina A, PA-C  ondansetron (ZOFRAN ODT) 4 MG disintegrating tablet Take 1 tablet (4 mg total) by mouth every 8 (eight) hours as needed for nausea or vomiting. 01/10/19   Nils Flack, Mina A, PA-C  ondansetron (ZOFRAN) 4 MG tablet  Take 1 tablet (4 mg total) by mouth every 6 (six) hours as needed for nausea or vomiting. 03/22/18   Dione Booze, MD  PRESCRIPTION MEDICATION Prescription cough meds.    [provider]  sitaGLIPtin-metformin (JANUMET) 50-1000 MG tablet Take 1 tablet by mouth 2 (two) times daily with a meal.    [provider]      Allergies    Sulfa antibiotics    Review of Systems   Review of Systems  Constitutional:  Negative for diaphoresis and fever.  HENT:  Negative for facial swelling.   Respiratory:  Negative for shortness of breath, wheezing and stridor.   Cardiovascular:  Negative for chest pain.   Gastrointestinal:  Negative for vomiting.  Musculoskeletal:  Positive for neck pain.  Neurological:  Negative for speech difficulty, weakness and headaches.  All other systems reviewed and are negative.   Physical Exam Updated Vital Signs BP 113/65 (BP Location: Right Arm)   Pulse 82   Temp 97.9 F (36.6 C) (Oral)   Resp 18   Ht 5\' 4"  (1.626 m)   Wt 120.2 kg   LMP 08/11/2022   SpO2 99%   BMI 45.49 kg/m  Physical Exam Vitals and nursing note reviewed. Exam conducted with a chaperone present.  Constitutional:      General: She is not in acute distress.    Appearance: She is well-developed.  HENT:     Head: Normocephalic and atraumatic.  Eyes:     Pupils: Pupils are equal, round, and reactive to light.  Neck:     Thyroid: No thyroid mass.     Vascular: No carotid bruit.  Cardiovascular:     Rate and Rhythm: Normal rate and regular rhythm.     Pulses: Normal pulses.     Heart sounds: Normal heart sounds.  Pulmonary:     Effort: Pulmonary effort is normal. No respiratory distress.     Breath sounds: Normal breath sounds.  Abdominal:     General: Bowel sounds are normal. There is no distension.     Palpations: Abdomen is soft.     Tenderness: There is no abdominal tenderness. There is no guarding or rebound.  Genitourinary:    Vagina: No vaginal discharge.  Musculoskeletal:        General: Normal range of motion.     Cervical back: Neck supple. No edema, erythema, rigidity or crepitus. Pain with movement present. No spinous process tenderness. Normal range of motion.  Lymphadenopathy:     Cervical: No cervical adenopathy.     Right cervical: No superficial cervical adenopathy.    Left cervical: No superficial cervical adenopathy.  Skin:    General: Skin is dry.     Capillary Refill: Capillary refill takes less than 2 seconds.     Findings: No erythema or rash.  Neurological:     General: No focal deficit present.     Mental Status: She is oriented to person,  place, and time.     Deep Tendon Reflexes: Reflexes normal.  Psychiatric:        Mood and Affect: Mood normal.     ED Results / Procedures / Treatments   Labs (all labs ordered are listed, but only abnormal results are displayed) Labs Reviewed - No data to display  EKG None  Radiology No results found.  Procedures Procedures    Medications Ordered in ED Medications  lidocaine (LIDODERM) 5 % 1 patch (has no administration in time range)  naproxen (NAPROSYN) tablet 500 mg (  500 mg Oral Given 08/18/22 0624)  methocarbamol (ROBAXIN) tablet 1,000 mg (1,000 mg Oral Given 08/18/22 0625)  acetaminophen (TYLENOL) tablet 1,000 mg (1,000 mg Oral Given 08/18/22 2778)    ED Course/ Medical Decision Making/ A&P                           Medical Decision Making Patient with 3 days of left pain worse with movement.  No CP no SOB.    Amount and/or Complexity of Data Reviewed External Data Reviewed: notes.    Details: Previous notes reviewed   Risk OTC drugs. Prescription drug management. Risk Details: Patient with symptoms of torticollis.  I do not believe this is cardiac nor infectious.  Pain is worse with movement.  No signs of LAN.  Heat, muscle relaxants and NSAIDs.  Stable for discharge, strict return.      Final Clinical Impression(s) / ED Diagnoses Final diagnoses:  Muscle spasms of neck   Return for intractable cough, coughing up blood, fevers > 100.4 unrelieved by medication, shortness of breath, intractable vomiting, chest pain, shortness of breath, weakness, numbness, changes in speech, facial asymmetry, abdominal pain, passing out, Inability to tolerate liquids or food, cough, altered mental status or any concerns. No signs of systemic illness or infection. The patient is nontoxic-appearing on exam and vital signs are within normal limits.  I have reviewed the triage vital signs and the nursing notes. Pertinent labs & imaging results that were available during my care of  the patient were reviewed by me and considered in my medical decision making (see chart for details). After history, exam, and medical workup I feel the patient has been appropriately medically screened and is safe for discharge home. Pertinent diagnoses were discussed with the patient. Patient was given return precautions. Rx / DC Orders ED Discharge Orders          Ordered    naproxen (NAPROSYN) 500 MG tablet  2 times daily with meals        08/18/22 0612    methocarbamol (ROBAXIN) 500 MG tablet  2 times daily        08/18/22 0612    lidocaine (LIDODERM) 5 %  Every 24 hours        08/18/22 0612              Miriana Gaertner, MD 08/18/22 2423

## 2022-08-18 NOTE — ED Triage Notes (Addendum)
L sided neck pain x 3 days. States she thought she slept wrong but pain has not subsided. She states its worse when she got up today. Denies fever, chest pain, SHOB or other sx. Worse with movement. Pt is able to move neck, but states neck feels stiff. No known injury. Heavy lifting at work.

## 2022-08-19 ENCOUNTER — Encounter (HOSPITAL_BASED_OUTPATIENT_CLINIC_OR_DEPARTMENT_OTHER): Payer: Self-pay

## 2022-08-19 ENCOUNTER — Emergency Department (HOSPITAL_BASED_OUTPATIENT_CLINIC_OR_DEPARTMENT_OTHER)
Admission: EM | Admit: 2022-08-19 | Discharge: 2022-08-19 | Disposition: A | Discharge status: Home / Self Care | Payer: Medicare Other | Attending: Emergency Medicine | Admitting: Emergency Medicine

## 2022-08-19 ENCOUNTER — Other Ambulatory Visit: Payer: Self-pay

## 2022-08-19 DIAGNOSIS — E119 Type 2 diabetes mellitus without complications: Secondary | ICD-10-CM | POA: Insufficient documentation

## 2022-08-19 DIAGNOSIS — F17210 Nicotine dependence, cigarettes, uncomplicated: Secondary | ICD-10-CM | POA: Diagnosis not present

## 2022-08-19 DIAGNOSIS — M5412 Radiculopathy, cervical region: Secondary | ICD-10-CM | POA: Diagnosis not present

## 2022-08-19 DIAGNOSIS — Z7984 Long term (current) use of oral hypoglycemic drugs: Secondary | ICD-10-CM | POA: Diagnosis not present

## 2022-08-19 DIAGNOSIS — M542 Cervicalgia: Secondary | ICD-10-CM | POA: Diagnosis present

## 2022-08-19 MED ORDER — OXYCODONE-ACETAMINOPHEN 10-325 MG PO TABS: 1.0000 | ORAL_TABLET | Freq: Four times a day (QID) | ORAL | 0 refills | Status: AC | PRN

## 2022-08-19 MED ORDER — OXYCODONE-ACETAMINOPHEN 5-325 MG PO TABS
2.0000 | ORAL_TABLET | Freq: Once | ORAL | Status: AC
  Administered 2022-08-19: 2 via ORAL
  Filled 2022-08-19: qty 2

## 2022-08-19 NOTE — ED Provider Notes (Signed)
[facility] Provider Note: Mary Dell, MD, FACEP  CSN: [id number] MRN: [id number] ARRIVAL: 08/19/22 at 0144 ROOM: [facility]12/[facility]12   CHIEF COMPLAINT  Neck Pain   HISTORY OF PRESENT ILLNESS  08/19/22 3:09 AM Mary Novak is a 46 y.o. female 3 days of left-sided neck pain.  She was seen for this in the ED yesterday and diagnosed with muscle spasms.  She was treated with naproxen, Robaxin and Lidoderm patches.  Returns with no improvement.  She states the pain is a 10 out of 10.  It is constant and not intermittent or paroxysmal.  It is worse with movement of her neck.  She also has hyperesthesia of the scalp in the left reciprocal nerve distribution.  The pain does not radiate to her upper back, upper chest or left shoulder or arm.   Past Medical History:  Diagnosis Date   ADHD (attention deficit hyperactivity disorder)    Cholecystitis 02/2016   Diabetes mellitus type 2, noninsulin dependent (HCC)    Ovarian cyst     Past Surgical History:  Procedure Laterality Date   CESAREAN SECTION  2011   CHOLECYSTECTOMY N/A 02/28/2016   Procedure: LAPAROSCOPIC CHOLECYSTECTOMY;  Surgeon: Abigail Miyamoto, MD;  Location: [facility];  Service: General;  Laterality: N/A;    History reviewed. No pertinent family history.  Social History   Tobacco Use   Smoking status: Every Day    Packs/day: 1.00    Years: 15.00    Total pack years: 15.00    Types: Cigarettes   Smokeless tobacco: Never  Vaping Use   Vaping Use: Never used  Substance Use Topics   Alcohol use: No   Drug use: No    Prior to Admission medications   Medication Sig Start Date End Date Taking? Authorizing Provider  oxyCODONE-acetaminophen (PERCOCET) 10-325 MG tablet Take 1 tablet by mouth every 6 (six) hours as needed for pain. 08/19/22  Yes Aryiana Klinkner, MD  albuterol (PROVENTIL) (2.5 MG/3ML) 0.083% nebulizer solution Take 2.5 mg by nebulization every 6 (six) hours as needed for wheezing or  shortness of breath.    [provider]  amphetamine-dextroamphetamine (ADDERALL) 30 MG tablet Take 30 mg by mouth 2 (two) times daily.    [provider]  buPROPion (WELLBUTRIN XL) 300 MG 24 hr tablet Take 300 mg by mouth daily.    [provider]  butalbital-aspirin-caffeine Benny Lennert) 50-325-40 MG tablet Take 1 tablet by mouth 2 (two) times daily as needed for headache.    [provider]  clotrimazole-betamethasone (LOTRISONE) cream APPLY TOPICALLY TWICE DAILY 12/31/18   [provider]  cyclobenzaprine (FLEXERIL) 10 MG tablet Take 1 tablet (10 mg total) by mouth 2 (two) times daily as needed for muscle spasms. 10/22/21   Curatolo, Adam, DO  dapagliflozin propanediol (FARXIGA) 5 MG TABS tablet Take 5 mg by mouth daily.    [provider]  desvenlafaxine (PRISTIQ) 50 MG 24 hr tablet Take 50 mg by mouth daily.    [provider]  dicyclomine (BENTYL) 20 MG tablet Take 1 tablet (20 mg total) by mouth 2 (two) times daily. 01/12/19   Joy, Shawn C, PA-C  glipiZIDE (GLUCOTROL) 5 MG tablet Take 5 mg by mouth daily before breakfast.    [provider]  ibuprofen (ADVIL,MOTRIN) 800 MG tablet Take 1 tablet (800 mg total) by mouth every 8 (eight) hours as needed for mild pain. 05/10/17   Ward, Layla Maw, DO  lidocaine (LIDODERM) 5 % Place 1  patch onto the skin daily. Remove & Discard patch within 12 hours or as directed by MD 08/18/22   Nicanor Alcon, April, MD  loperamide (IMODIUM) 2 MG capsule Take 1 capsule (2 mg total) by mouth 4 (four) times daily as needed for diarrhea or loose stools. 01/10/19   Fawze, Mina A, PA-C  methocarbamol (ROBAXIN) 500 MG tablet Take 1 tablet (500 mg total) by mouth 2 (two) times daily. 08/18/22   Palumbo, April, MD  naproxen (NAPROSYN) 500 MG tablet Take 1 tablet (500 mg total) by mouth 2 (two) times daily with a meal. 08/18/22   Palumbo, April, MD  ondansetron (ZOFRAN ODT) 4 MG disintegrating tablet Take 1 tablet (4 mg  total) by mouth every 8 (eight) hours as needed for nausea or vomiting. 01/10/19   Luevenia Maxin, Mina A, PA-C  ondansetron (ZOFRAN) 4 MG tablet Take 1 tablet (4 mg total) by mouth every 6 (six) hours as needed for nausea or vomiting. 03/22/18   Dione Booze, MD  PRESCRIPTION MEDICATION Prescription cough meds.    [provider]  sitaGLIPtin-metformin (JANUMET) 50-1000 MG tablet Take 1 tablet by mouth 2 (two) times daily with a meal.    [provider]    Allergies Sulfa antibiotics and Sulfasalazine   REVIEW OF SYSTEMS  Negative except as noted here or in the History of Present Illness.   PHYSICAL EXAMINATION  Initial Vital Signs Blood pressure (!) 154/96, pulse (!) 103, temperature 97.8 F (36.6 C), resp. rate 18, last menstrual period 08/11/2022, SpO2 100 %.  Examination General: Well-developed, well-nourished female in no acute distress; appearance consistent with age of record HENT: normocephalic; atraumatic Eyes: Normal appearance Neck: Decreased range of motion with pain on attempted movement Heart: regular rate and rhythm Lungs: clear to auscultation bilaterally Abdomen: soft; nondistended; nontender; bowel sounds present Extremities: No deformity; full range of motion Neurologic: Awake, alert and oriented; motor function intact in all extremities and symmetric; no facial droop Skin: Warm and dry Psychiatric: Tearful   RESULTS  Summary of this visit's results, reviewed and interpreted by myself:   EKG Interpretation  Date/Time:    Ventricular Rate:    PR Interval:    QRS Duration:   QT Interval:    QTC Calculation:   R Axis:     Text Interpretation:         Laboratory Studies: No results found for this or any previous visit (from the past 24 hour(s)). Imaging Studies: No results found.  ED COURSE and MDM  Nursing notes, initial and subsequent vitals signs, including pulse oximetry, reviewed and interpreted by myself.  Vitals:   08/19/22 0149   BP: (!) 154/96  Pulse: (!) 103  Resp: 18  Temp: 97.8 F (36.6 C)  SpO2: 100%   Medications - No data to display  I suspect the patient has cervical radiculopathy affecting the left lesser occipital nerve.  There is no intermittent or paroxysmal element to suggest a simple neuralgia.  We will treat with pain medication and refer to neurosurgery.  I do not believe her pain is caused by muscle spasms.  PROCEDURES  Procedures   ED DIAGNOSES     ICD-10-CM   1. Cervical radiculopathy  M54.12          Anavi Branscum, Jonny Ruiz, MD 08/19/22 [phone]

## 2022-08-19 NOTE — ED Triage Notes (Signed)
Left sided neck pain x 3 days. No injury. Seen this morning for same. Has been taking naproxen, and muscle relaxer's with no improvement.

## 2023-03-24 ENCOUNTER — Encounter (HOSPITAL_BASED_OUTPATIENT_CLINIC_OR_DEPARTMENT_OTHER): Payer: Self-pay | Admitting: Emergency Medicine

## 2023-03-24 ENCOUNTER — Other Ambulatory Visit: Payer: Self-pay

## 2023-03-24 ENCOUNTER — Emergency Department (HOSPITAL_BASED_OUTPATIENT_CLINIC_OR_DEPARTMENT_OTHER)
Admission: EM | Admit: 2023-03-24 | Discharge: 2023-03-24 | Disposition: A | Discharge status: Home / Self Care | Payer: 59 | Attending: Emergency Medicine | Admitting: Emergency Medicine

## 2023-03-24 DIAGNOSIS — S50869A Insect bite (nonvenomous) of unspecified forearm, initial encounter: Secondary | ICD-10-CM | POA: Insufficient documentation

## 2023-03-24 DIAGNOSIS — W57XXXA Bitten or stung by nonvenomous insect and other nonvenomous arthropods, initial encounter: Secondary | ICD-10-CM | POA: Diagnosis not present

## 2023-03-24 DIAGNOSIS — L0231 Cutaneous abscess of buttock: Secondary | ICD-10-CM | POA: Insufficient documentation

## 2023-03-24 DIAGNOSIS — E119 Type 2 diabetes mellitus without complications: Secondary | ICD-10-CM | POA: Insufficient documentation

## 2023-03-24 MED ORDER — DOXYCYCLINE HYCLATE 100 MG PO CAPS: 100.0000 mg | ORAL_CAPSULE | Freq: Two times a day (BID) | ORAL | 0 refills | Status: AC

## 2023-03-24 MED ORDER — DOXYCYCLINE HYCLATE 100 MG PO TABS
100.0000 mg | ORAL_TABLET | Freq: Once | ORAL | Status: AC
  Administered 2023-03-24: 100 mg via ORAL
  Filled 2023-03-24: qty 1

## 2023-03-24 NOTE — ED Triage Notes (Signed)
Patient arrived via POV c/o insect bites w/ possible abscess on buttocks. Patient states recent trip to [place], noticing them 2 days prior. Patient states they are generalized. Patient endorses itching. Patient is AO x 4, VS WDL, normal gait.

## 2023-03-24 NOTE — ED Provider Notes (Signed)
[facility]  Provider Note  CSN: [id number] Arrival date & time: 03/24/23 0137  History Chief Complaint  Patient presents with   Insect Bite    Mary Novak is a 47 y.o. female with history of DM reports a boil on her buttock that has been draining some fluid for the last 2 days. Recently returned from trip to [place] where she also sustained multiple insect bites to arms. No fever.    Home Medications Prior to Admission medications   Medication Sig Start Date End Date Taking? Authorizing Provider  doxycycline (VIBRAMYCIN) 100 MG capsule Take 1 capsule (100 mg total) by mouth 2 (two) times daily. 03/24/23  Yes Truddie Hidden, MD  albuterol (PROVENTIL) (2.5 MG/3ML) 0.083% nebulizer solution Take 2.5 mg by nebulization every 6 (six) hours as needed for wheezing or shortness of breath.    [provider]  amphetamine-dextroamphetamine (ADDERALL) 30 MG tablet Take 30 mg by mouth 2 (two) times daily.    [provider]  buPROPion (WELLBUTRIN XL) 300 MG 24 hr tablet Take 300 mg by mouth daily.    [provider]  butalbital-aspirin-caffeine Acquanetta Chain) 50-325-40 MG tablet Take 1 tablet by mouth 2 (two) times daily as needed for headache.    [provider]  clotrimazole-betamethasone (LOTRISONE) cream APPLY TOPICALLY TWICE DAILY 12/31/18   [provider]  cyclobenzaprine (FLEXERIL) 10 MG tablet Take 1 tablet (10 mg total) by mouth 2 (two) times daily as needed for muscle spasms. 10/22/21   Curatolo, Adam, DO  dapagliflozin propanediol (FARXIGA) 5 MG TABS tablet Take 5 mg by mouth daily.    [provider]  desvenlafaxine (PRISTIQ) 50 MG 24 hr tablet Take 50 mg by mouth daily.    [provider]  dicyclomine (BENTYL) 20 MG tablet Take 1 tablet (20 mg total) by mouth 2 (two) times daily. 01/12/19   Joy, Shawn C, PA-C  glipiZIDE (GLUCOTROL) 5 MG tablet Take 5 mg by mouth daily before  breakfast.    [provider]  ibuprofen (ADVIL,MOTRIN) 800 MG tablet Take 1 tablet (800 mg total) by mouth every 8 (eight) hours as needed for mild pain. 05/10/17   Ward, Delice Bison, DO  lidocaine (LIDODERM) 5 % Place 1 patch onto the skin daily. Remove & Discard patch within 12 hours or as directed by MD 08/18/22   Randal Buba, April, MD  loperamide (IMODIUM) 2 MG capsule Take 1 capsule (2 mg total) by mouth 4 (four) times daily as needed for diarrhea or loose stools. 01/10/19   Fawze, Mina A, PA-C  methocarbamol (ROBAXIN) 500 MG tablet Take 1 tablet (500 mg total) by mouth 2 (two) times daily. 08/18/22   Palumbo, April, MD  naproxen (NAPROSYN) 500 MG tablet Take 1 tablet (500 mg total) by mouth 2 (two) times daily with a meal. 08/18/22   Palumbo, April, MD  ondansetron (ZOFRAN ODT) 4 MG disintegrating tablet Take 1 tablet (4 mg total) by mouth every 8 (eight) hours as needed for nausea or vomiting. 01/10/19   Nils Flack, Mina A, PA-C  ondansetron (ZOFRAN) 4 MG tablet Take 1 tablet (4 mg total) by mouth every 6 (six) hours as needed for nausea or vomiting. [id number]   Delora Fuel, MD  oxyCODONE-acetaminophen (PERCOCET) 10-325 MG tablet Take 1 tablet by mouth every 6 (six) hours as needed for pain. 08/19/22   Molpus, Jenny Reichmann, MD  PRESCRIPTION MEDICATION Prescription cough meds.    [provider]  sitaGLIPtin-metformin Carlyn Reichert)  50-1000 MG tablet Take 1 tablet by mouth 2 (two) times daily with a meal.    [provider]     Allergies    Sulfa antibiotics and Sulfasalazine   Review of Systems   Review of Systems Please see HPI for pertinent positives and negatives  Physical Exam BP (!) 154/81 (BP Location: Right Arm)   Pulse 87   Temp 98.6 F (37 C) (Oral)   Resp 18   Ht 5\' 4"  (1.626 m)   Wt 117.9 kg   LMP 03/06/2023 (Exact Date)   SpO2 100%   BMI 44.63 kg/m   Physical Exam Vitals and nursing note reviewed.  HENT:     Head: Normocephalic.     Nose: Nose normal.  Eyes:      Extraocular Movements: Extraocular movements intact.  Pulmonary:     Effort: Pulmonary effort is normal.  Musculoskeletal:        General: Normal range of motion.     Cervical back: Neck supple.  Skin:    Findings: Rash (multiple insect bites to arms.) present.     Comments: Chaperone present. There is a small 1cm shallow ulceration on R buttock near gluteal cleft with surrounding erythema, induration and tenderness without fluctuance  Neurological:     Mental Status: She is alert and oriented to person, place, and time.  Psychiatric:        Mood and Affect: Mood normal.     ED Results / Procedures / Treatments   EKG None  Procedures Procedures  Medications Ordered in the ED Medications  doxycycline (VIBRA-TABS) tablet 100 mg (has no administration in time range)    Initial Impression and Plan  Patient here with what appears to be a spontaneously drained abscess on buttock. Some surrounding cellulitis, will Rx doxycycline. Recommend conservative therapy for insect bites.   ED Course       MDM Rules/Calculators/A&P Medical Decision Making Problems Addressed: Abscess of buttock: acute illness or injury Insect bite of forearm, unspecified laterality, initial encounter: acute illness or injury  Risk Prescription drug management.     Final Clinical Impression(s) / ED Diagnoses Final diagnoses:  Insect bite of forearm, unspecified laterality, initial encounter  Abscess of buttock    Rx / DC Orders ED Discharge Orders          Ordered    doxycycline (VIBRAMYCIN) 100 MG capsule  2 times daily        03/24/23 0257             Truddie Hidden, MD 03/24/23 [phone]

## 2024-09-19 ENCOUNTER — Other Ambulatory Visit: Payer: Self-pay | Admitting: Physician Assistant

## 2024-09-19 DIAGNOSIS — Z1231 Encounter for screening mammogram for malignant neoplasm of breast: Secondary | ICD-10-CM

## 2024-11-03 ENCOUNTER — Other Ambulatory Visit: Payer: Self-pay
